# Patient Record
Sex: Female | Born: 1970 | Race: White | Hispanic: No | Marital: Single | Smoking: Never smoker
Health system: Southern US, Community
[De-identification: ages and names within clinical notes are randomized; demographics above are authoritative.]

## PROBLEM LIST (undated history)

## (undated) HISTORY — PX: ENDOMETRIAL ABLATION: SHX621

## (undated) HISTORY — PX: OTHER SURGICAL HISTORY: SHX169

---

## 2000-10-31 ENCOUNTER — Emergency Department (HOSPITAL_COMMUNITY): Admission: EM | Admit: 2000-10-31 | Discharge: 2000-10-31 | Payer: Self-pay | Admitting: Emergency Medicine

## 2000-10-31 ENCOUNTER — Encounter: Payer: Self-pay | Admitting: Emergency Medicine

## 2001-06-16 ENCOUNTER — Ambulatory Visit (HOSPITAL_COMMUNITY): Admission: RE | Admit: 2001-06-16 | Discharge: 2001-06-16 | Payer: Self-pay | Admitting: *Deleted

## 2002-03-01 ENCOUNTER — Other Ambulatory Visit: Admission: RE | Admit: 2002-03-01 | Discharge: 2002-03-01 | Payer: Self-pay | Admitting: *Deleted

## 2002-11-13 ENCOUNTER — Emergency Department (HOSPITAL_COMMUNITY): Admission: EM | Admit: 2002-11-13 | Discharge: 2002-11-13 | Payer: Self-pay | Admitting: Emergency Medicine

## 2003-02-01 ENCOUNTER — Other Ambulatory Visit: Admission: RE | Admit: 2003-02-01 | Discharge: 2003-02-01 | Payer: Self-pay | Admitting: *Deleted

## 2003-04-16 ENCOUNTER — Encounter: Payer: Self-pay | Admitting: Emergency Medicine

## 2003-04-16 ENCOUNTER — Emergency Department (HOSPITAL_COMMUNITY): Admission: EM | Admit: 2003-04-16 | Discharge: 2003-04-17 | Payer: Self-pay | Admitting: Emergency Medicine

## 2003-06-01 ENCOUNTER — Encounter: Payer: Self-pay | Admitting: Family Medicine

## 2003-06-01 ENCOUNTER — Encounter: Admission: RE | Admit: 2003-06-01 | Discharge: 2003-06-01 | Payer: Self-pay | Admitting: Family Medicine

## 2003-06-14 ENCOUNTER — Encounter: Payer: Self-pay | Admitting: Family Medicine

## 2003-06-14 ENCOUNTER — Ambulatory Visit (HOSPITAL_COMMUNITY): Admission: RE | Admit: 2003-06-14 | Discharge: 2003-06-14 | Payer: Self-pay | Admitting: Family Medicine

## 2004-10-03 ENCOUNTER — Ambulatory Visit: Payer: Self-pay | Admitting: Family Medicine

## 2004-10-03 ENCOUNTER — Other Ambulatory Visit: Admission: RE | Admit: 2004-10-03 | Discharge: 2004-10-03 | Payer: Self-pay | Admitting: Family Medicine

## 2005-01-15 ENCOUNTER — Ambulatory Visit: Payer: Self-pay | Admitting: Family Medicine

## 2005-05-21 ENCOUNTER — Encounter (INDEPENDENT_AMBULATORY_CARE_PROVIDER_SITE_OTHER): Payer: Self-pay | Admitting: *Deleted

## 2005-05-21 ENCOUNTER — Ambulatory Visit (HOSPITAL_COMMUNITY): Admission: RE | Admit: 2005-05-21 | Discharge: 2005-05-21 | Payer: Self-pay | Admitting: Obstetrics and Gynecology

## 2005-05-24 ENCOUNTER — Inpatient Hospital Stay (HOSPITAL_COMMUNITY): Admission: AD | Admit: 2005-05-24 | Discharge: 2005-05-26 | Payer: Self-pay | Admitting: Obstetrics and Gynecology

## 2005-05-29 ENCOUNTER — Inpatient Hospital Stay (HOSPITAL_COMMUNITY): Admission: AD | Admit: 2005-05-29 | Discharge: 2005-06-01 | Payer: Self-pay | Admitting: Obstetrics and Gynecology

## 2005-05-29 ENCOUNTER — Ambulatory Visit: Payer: Self-pay | Admitting: Internal Medicine

## 2005-05-31 ENCOUNTER — Ambulatory Visit: Payer: Self-pay | Admitting: Internal Medicine

## 2005-05-31 ENCOUNTER — Encounter (INDEPENDENT_AMBULATORY_CARE_PROVIDER_SITE_OTHER): Payer: Self-pay | Admitting: Specialist

## 2005-06-21 ENCOUNTER — Ambulatory Visit: Payer: Self-pay | Admitting: Family Medicine

## 2005-08-19 ENCOUNTER — Ambulatory Visit: Payer: Self-pay | Admitting: Family Medicine

## 2005-09-09 ENCOUNTER — Ambulatory Visit: Payer: Self-pay | Admitting: Family Medicine

## 2006-01-06 ENCOUNTER — Ambulatory Visit: Payer: Self-pay | Admitting: Family Medicine

## 2006-07-23 ENCOUNTER — Emergency Department: Payer: Self-pay

## 2006-10-26 IMAGING — CT CT ANGIO CHEST
3 series · 19 of 29 positions shown · IV contrast (150ml omni/300%)
Comparison: none

CLINICAL DATA: Hypoxia and fever.  Post-op from diagnostic laparoscopy.  Elevate for pulmonary embolism.  
CT ANGIOGRAPHY OF CHEST WITH CONTRAST:
TECHNIQUE: Multidetector CT imaging of the chest was performed during bolus injection of intravenous contrast.  Multiplanar CT angiographic image reconstructions were generated to evaluate the vascular anatomy.
Contrast:  150 cc Omnipaque 300
Satisfactory opacification of the pulmonary arteries is seen and there is no evidence of acute pulmonary embolism.  There is no evidence of thoracic aortic aneurysm or dissection.  There is no evidence of hilar or mediastinal mass.  No adenopathy is seen with in the thorax.
Tiny pleural effusions are noted bilaterally.  There is no evidence of pulmonary consolidation or mass.  Interstitial prominence is seen and mild interstitial edema cannot be excluded.  A small amount of free intraperitoneal air is also noted beneath the right hemidiaphragm, consistent with diagnostic laparoscopy performed yesterday.

[Series 2: pe chest · axial · 0.67mm/px · z∈[-242,-82]mm · 7 of 90 slices shown]
[im 13/90  lung]
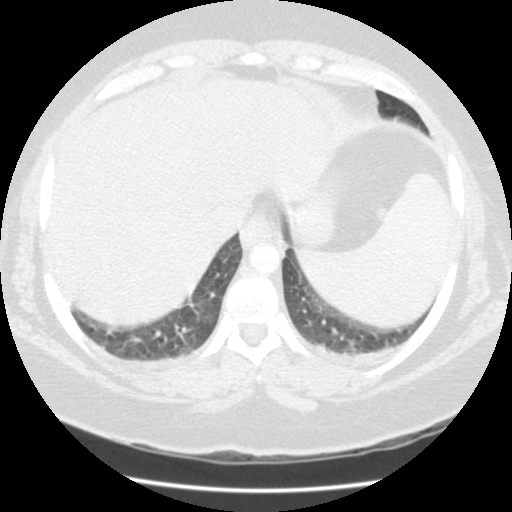
[im 26/90  mediastinal]
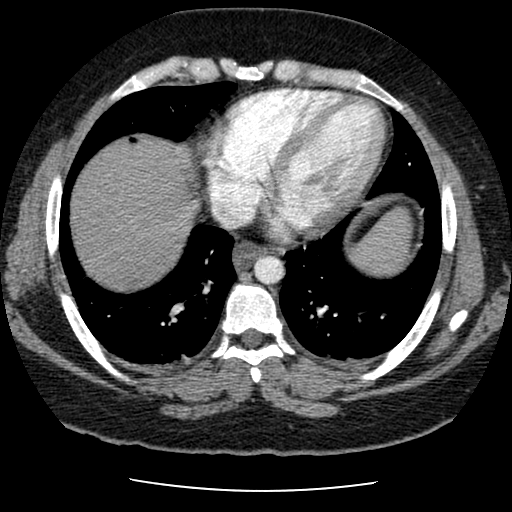
[im 39/90  lung]
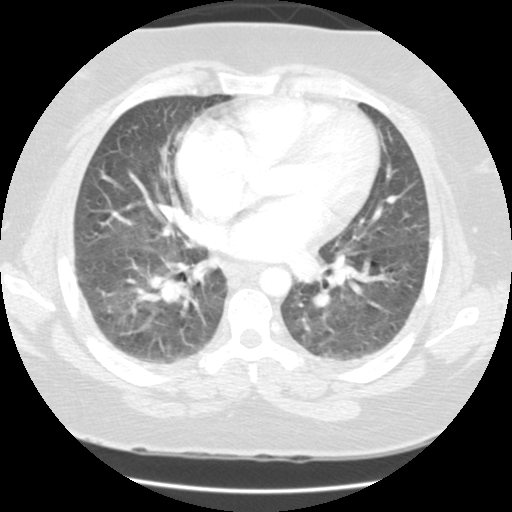
[im 47/90  mediastinal]
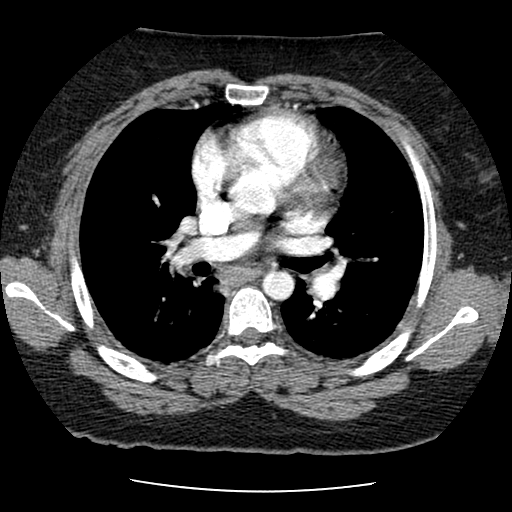
[im 51/90  lung]
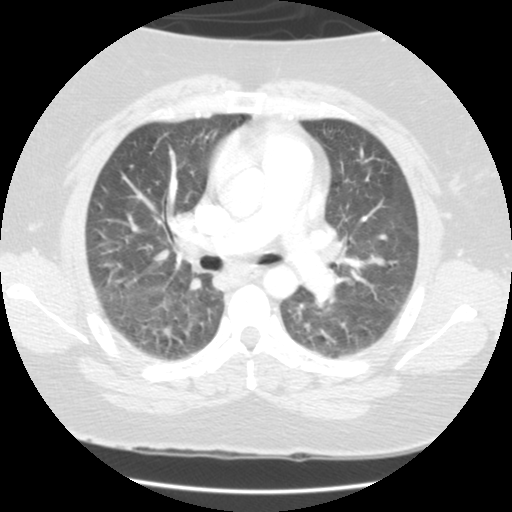
[im 64/90  mediastinal]
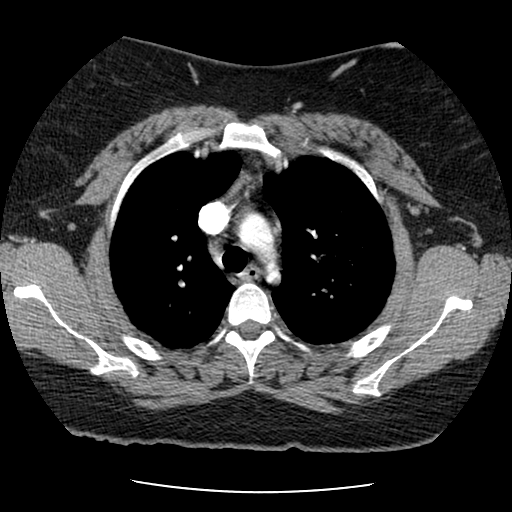
[im 77/90  lung]
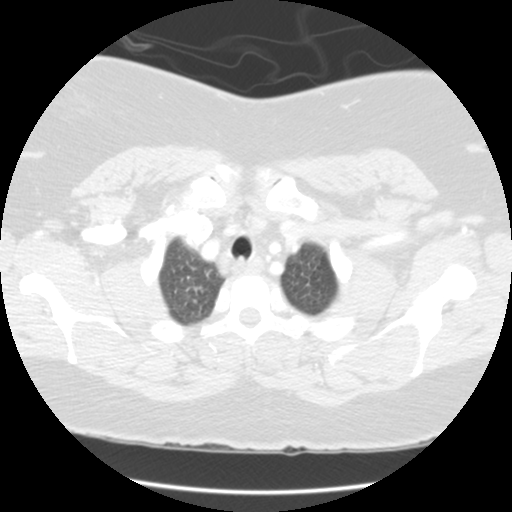

[Series 400: reformatted · coronal · 0.47mm/px · 8 of 152 slices shown (1 of 2)]
[im 13/152  lung]
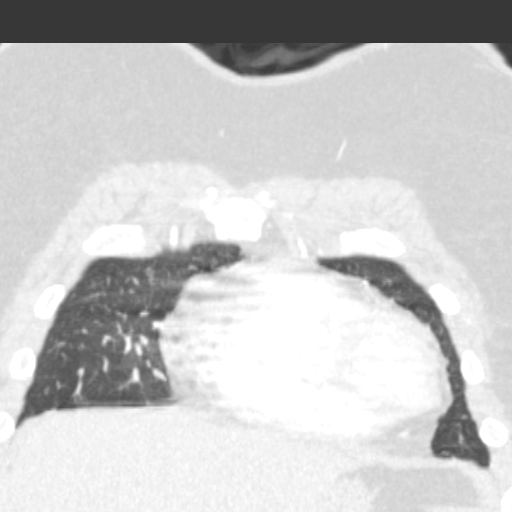
[im 38/152  lung]
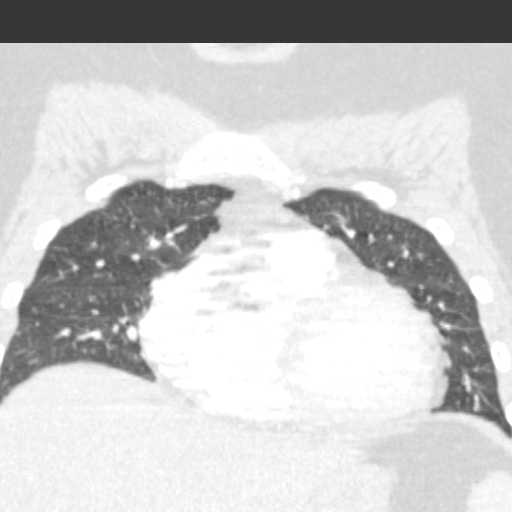
[im 51/152  lung]
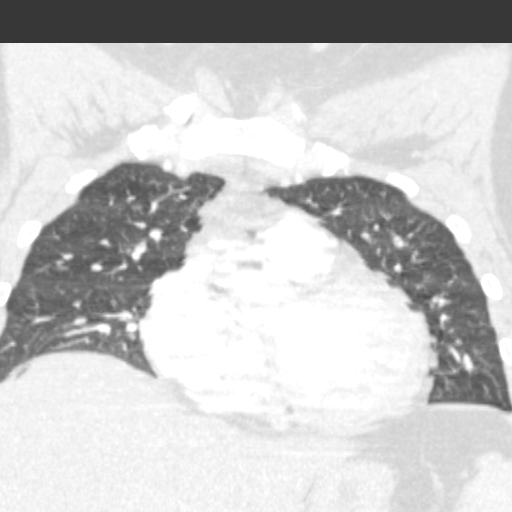
[im 63/152  lung]
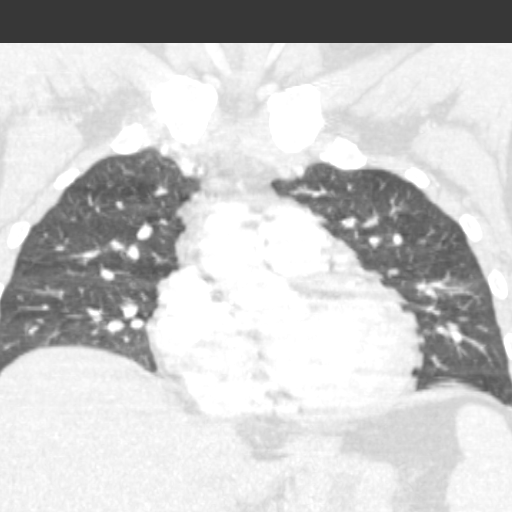
[im 89/152  lung]
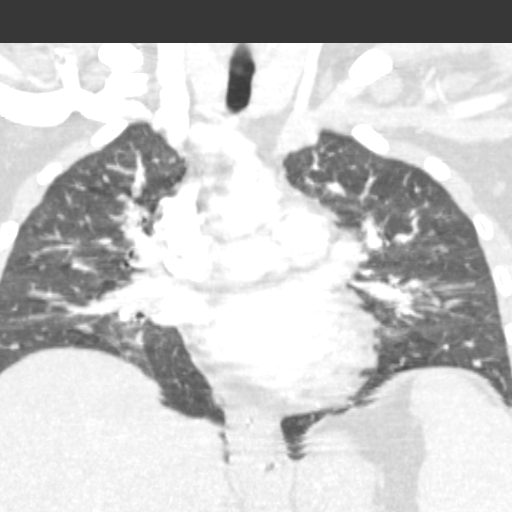
[im 101/152  lung]
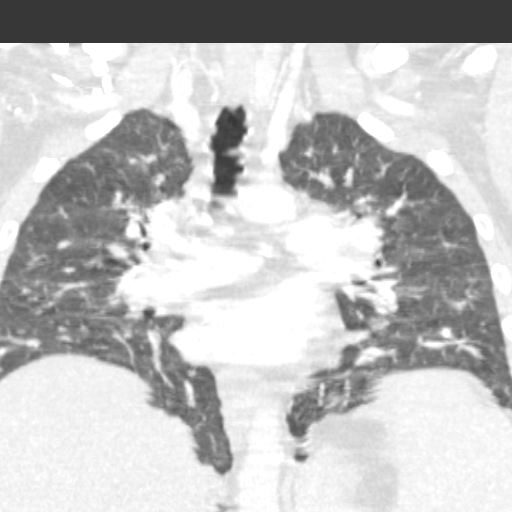
[im 114/152  lung]
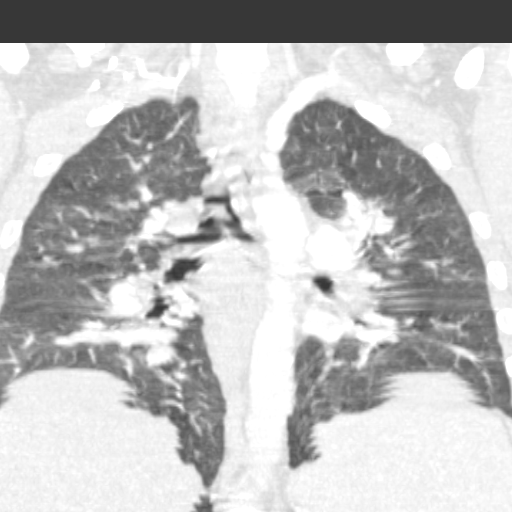
[im 139/152  lung]
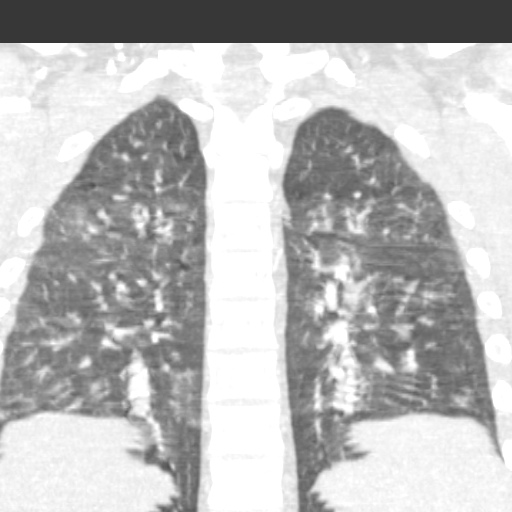

[Series 401: reformatted · sagittal · 0.47mm/px · 4 of 83 slices shown (2 of 2)]
[im 14/83  lung]
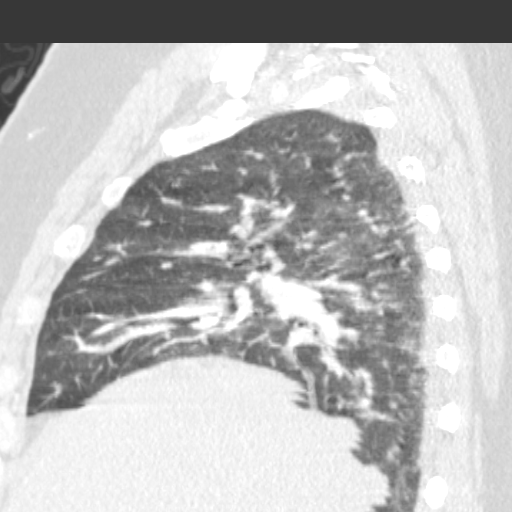
[im 28/83  lung]
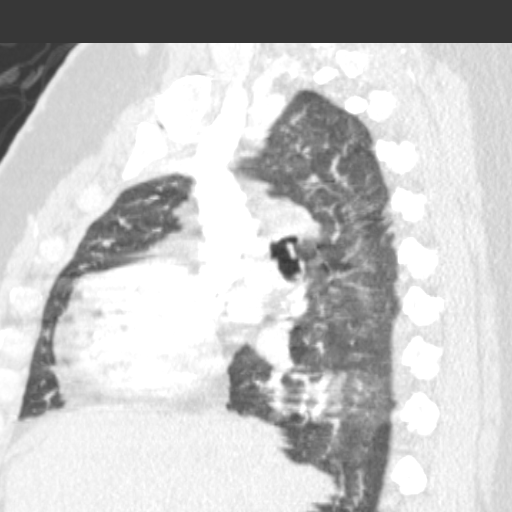
[im 42/83  lung]
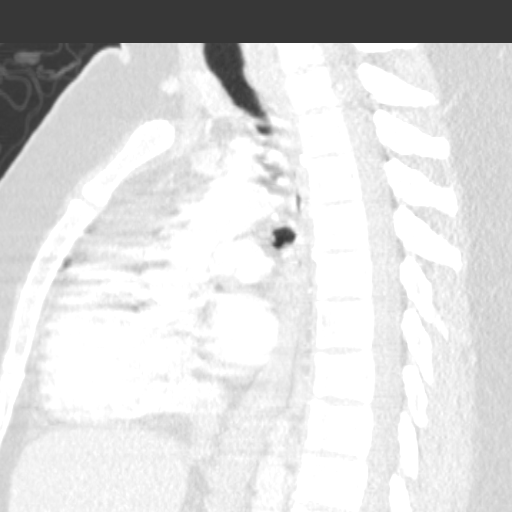
[im 55/83  lung]
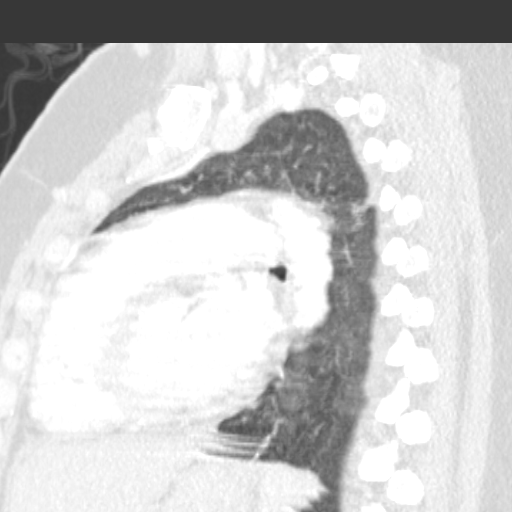

[19 of 29 positions shown; findings below may reference images not displayed]

IMPRESSION: 1.  No evidence of acute pulmonary embolism.  
2.  Tiny bilateral pleural effusions.  Pulmonary interstitial prominence suspicious for mild interstitial edema.

## 2007-03-16 ENCOUNTER — Ambulatory Visit: Payer: Self-pay | Admitting: Family Medicine

## 2007-03-20 ENCOUNTER — Ambulatory Visit: Payer: Self-pay | Admitting: Family Medicine

## 2007-03-20 LAB — CONVERTED CEMR LAB
CO2: 21 meq/L (ref 19–32)
Creatinine, Ser: 0.99 mg/dL (ref 0.40–1.20)
Eosinophils Absolute: 0.1 10*3/uL (ref 0.0–0.7)
Eosinophils Relative: 1 % (ref 0–5)
Glucose, Bld: 98 mg/dL (ref 70–99)
Hemoglobin: 16.6 g/dL — ABNORMAL HIGH (ref 12.0–15.0)
Lymphocytes Relative: 35 % (ref 12–46)
Lymphs Abs: 2.4 10*3/uL (ref 0.7–3.3)
Monocytes Absolute: 0.5 10*3/uL (ref 0.2–0.7)
Neutro Abs: 3.8 10*3/uL (ref 1.7–7.7)
Neutrophils Relative %: 56 % (ref 43–77)
Platelets: 193 10*3/uL (ref 150–400)
T4, Total: 9.9 ug/dL (ref 5.0–12.5)
TSH: 0.637 microintl units/mL (ref 0.350–5.50)

## 2007-08-26 ENCOUNTER — Ambulatory Visit (HOSPITAL_BASED_OUTPATIENT_CLINIC_OR_DEPARTMENT_OTHER): Admission: RE | Admit: 2007-08-26 | Discharge: 2007-08-26 | Payer: Self-pay | Admitting: Orthopedic Surgery

## 2007-10-03 DIAGNOSIS — R1031 Right lower quadrant pain: Secondary | ICD-10-CM

## 2007-10-07 ENCOUNTER — Ambulatory Visit: Payer: Self-pay | Admitting: Family Medicine

## 2007-10-08 ENCOUNTER — Telehealth: Payer: Self-pay | Admitting: Family Medicine

## 2007-11-17 ENCOUNTER — Ambulatory Visit: Payer: Self-pay | Admitting: Family Medicine

## 2007-11-26 ENCOUNTER — Encounter: Payer: Self-pay | Admitting: Obstetrics and Gynecology

## 2007-11-26 ENCOUNTER — Emergency Department (HOSPITAL_COMMUNITY): Admission: EM | Admit: 2007-11-26 | Discharge: 2007-11-26 | Payer: Self-pay | Admitting: Emergency Medicine

## 2008-01-07 ENCOUNTER — Telehealth (INDEPENDENT_AMBULATORY_CARE_PROVIDER_SITE_OTHER): Payer: Self-pay | Admitting: *Deleted

## 2008-01-12 ENCOUNTER — Encounter: Admission: RE | Admit: 2008-01-12 | Discharge: 2008-01-12 | Payer: Self-pay | Admitting: Family Medicine

## 2008-03-08 ENCOUNTER — Encounter: Payer: Self-pay | Admitting: Family Medicine

## 2008-04-15 ENCOUNTER — Ambulatory Visit: Payer: Self-pay | Admitting: Internal Medicine

## 2008-04-15 DIAGNOSIS — M545 Low back pain: Secondary | ICD-10-CM

## 2008-04-15 LAB — CONVERTED CEMR LAB
Glucose, Urine, Semiquant: NEGATIVE
Protein, U semiquant: NEGATIVE

## 2008-08-03 ENCOUNTER — Ambulatory Visit (HOSPITAL_COMMUNITY): Admission: RE | Admit: 2008-08-03 | Discharge: 2008-08-03 | Payer: Self-pay | Admitting: Obstetrics and Gynecology

## 2008-08-03 ENCOUNTER — Encounter (INDEPENDENT_AMBULATORY_CARE_PROVIDER_SITE_OTHER): Payer: Self-pay | Admitting: Obstetrics and Gynecology

## 2009-04-24 IMAGING — US US TRANSVAGINAL NON-OB
1 series · 14 of 25 positions shown · non-contrast
Comparison: none

CLINICAL DATA: History of ovarian cyst. Pain in the right lower quadrant.
 TRANSABDOMINAL AND TRANSVAGINAL PELVIC ULTRASOUND:
TECHNIQUE: Both transabdominal and transvaginal ultrasound examinations of the pelvis were performed including evaluation of the uterus, ovaries, adnexal regions, and pelvic cul-de-sac.

[Series 1: us transvaginal non-ob · 0.30mm/px · 14 of 36 slices shown]
[im 1/36]
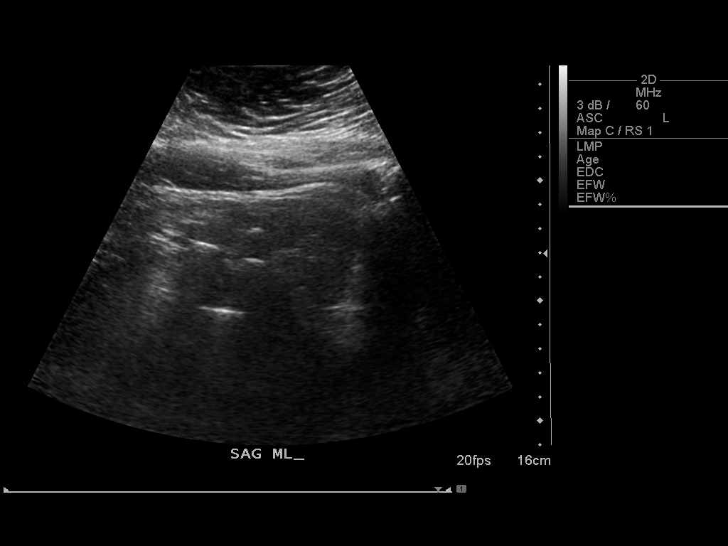
[im 3/36]
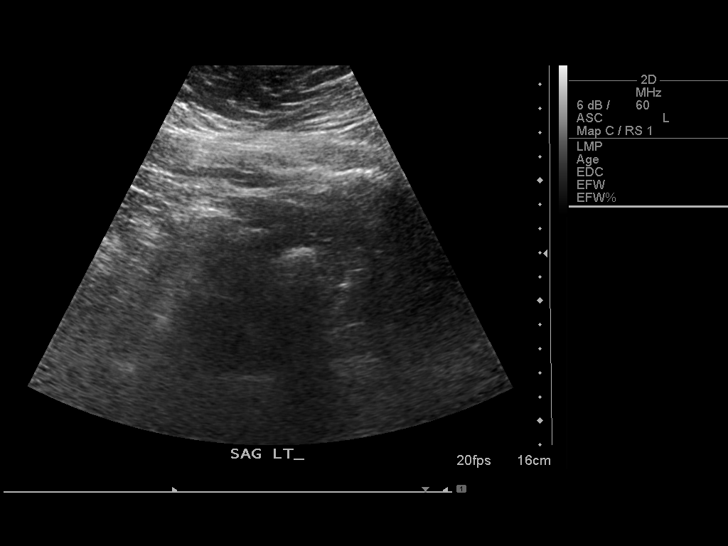
[im 6/36]
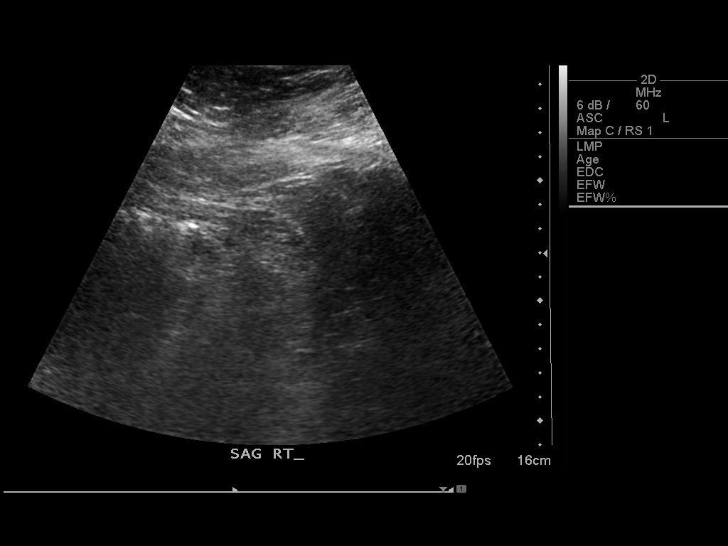
[im 9/36]
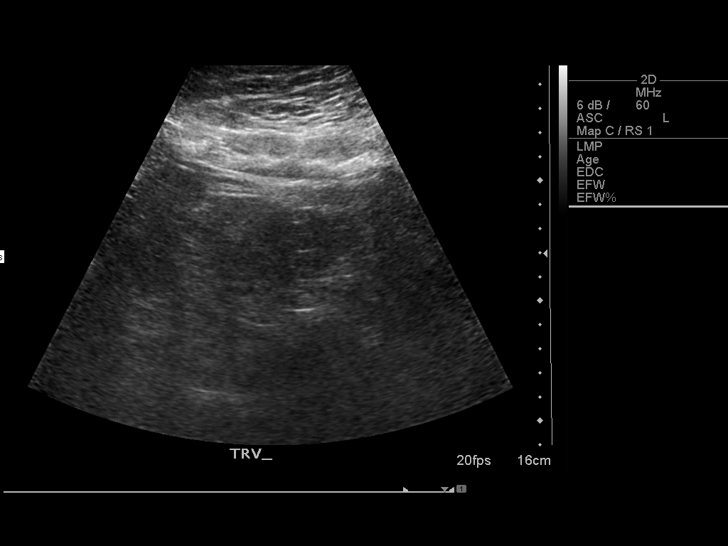
[im 12/36]
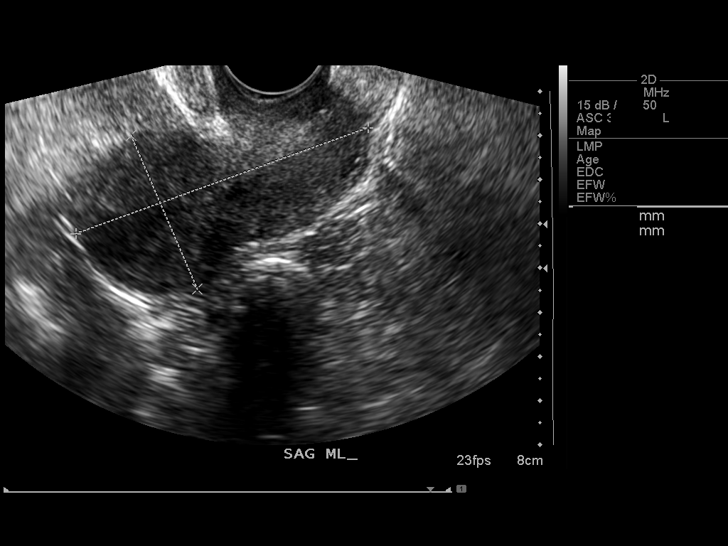
[im 14/36]
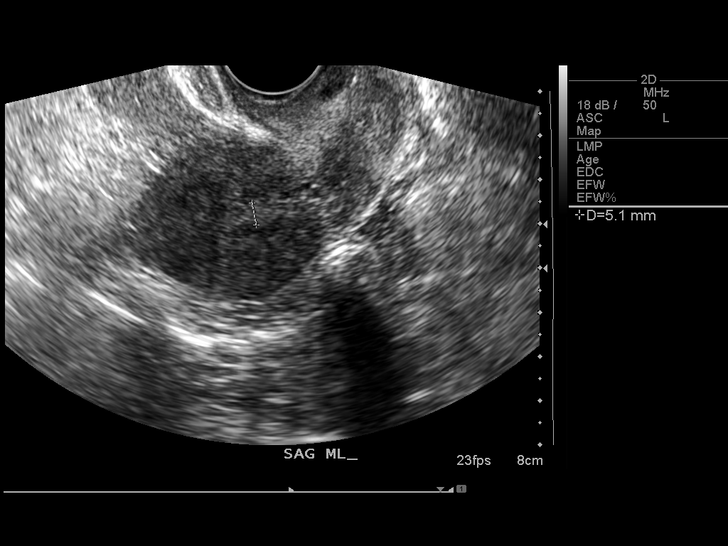
[im 17/36]
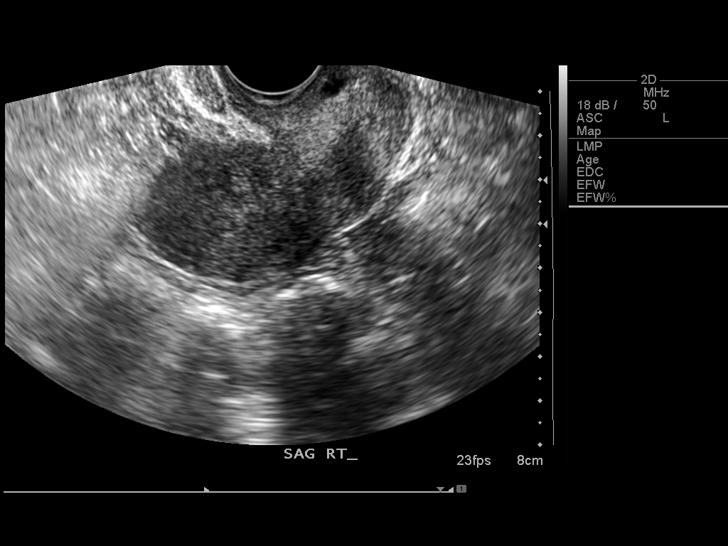
[im 19/36]
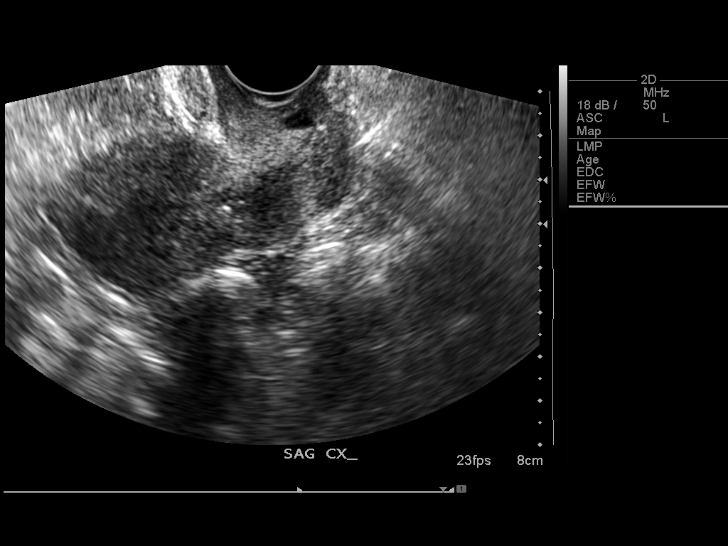
[im 22/36]
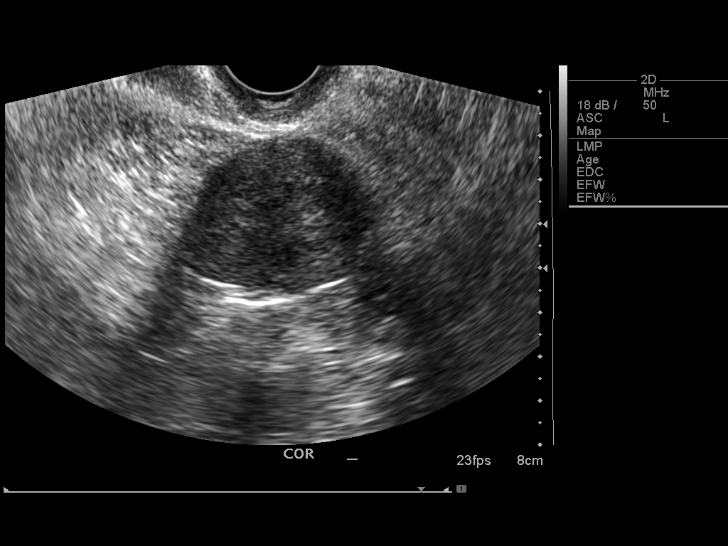
[im 24/36]
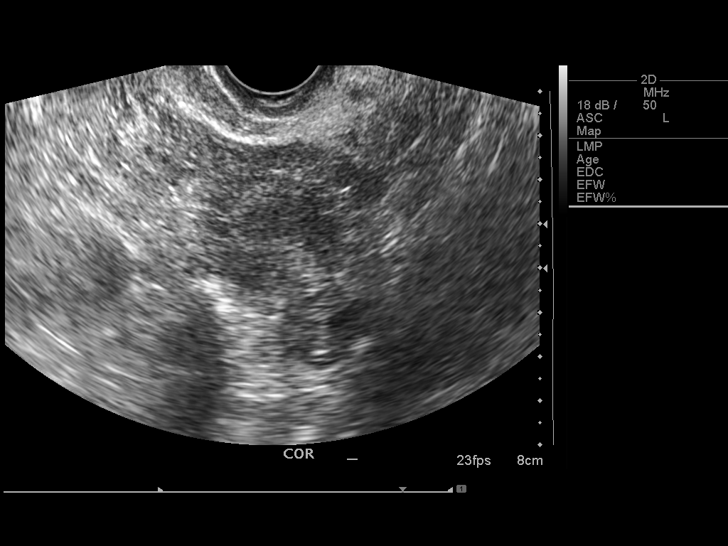
[im 27/36]
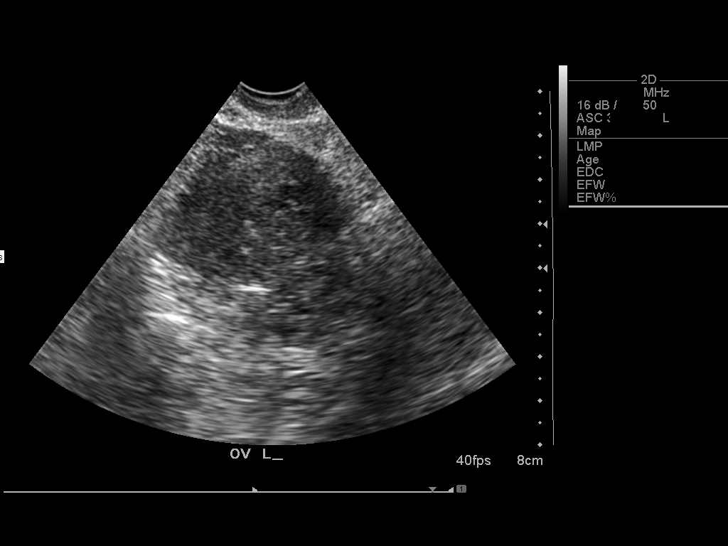
[im 30/36]
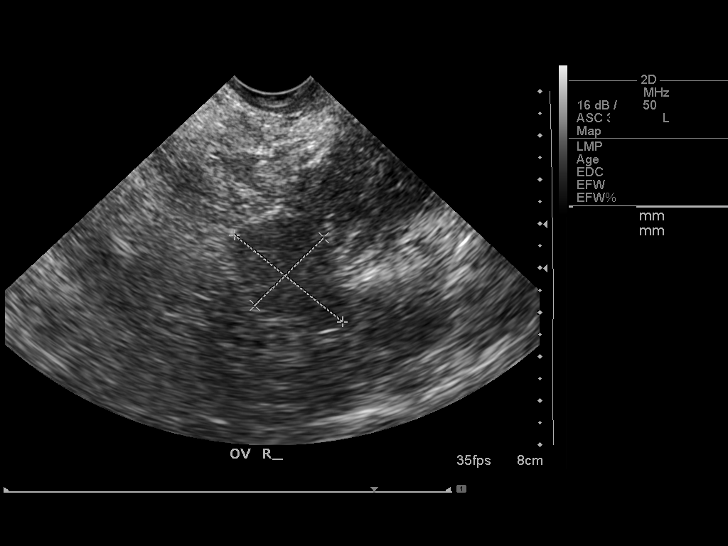
[im 33/36]
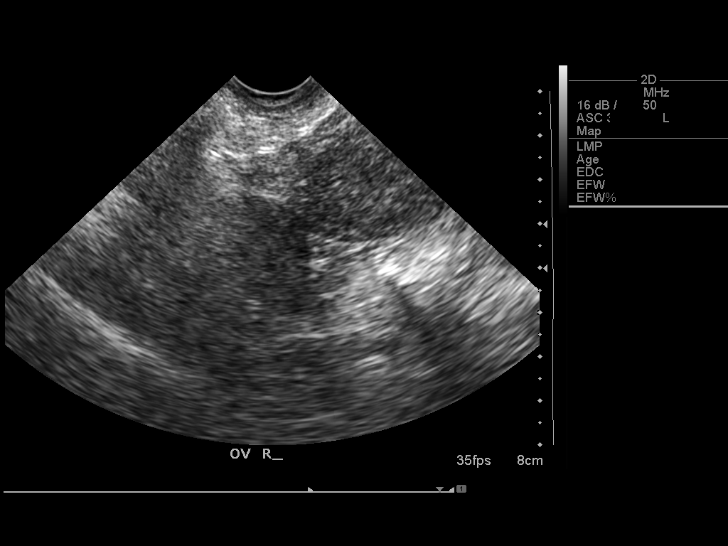
[im 36/36]
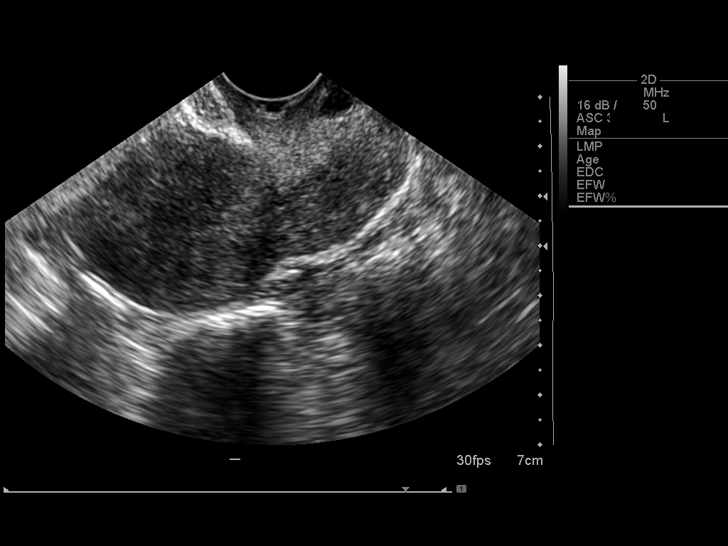

[14 of 25 positions shown; findings below may reference images not displayed]

FINDINGS: The uterus measures 7.0 cm sagittally with a depth of 3.7 cm and width of 4.2 cm.  Endometrium is not well seen secondary to prior ablation with a maximum thickness noted of 5.1 mm.  The ovaries are normal in size with no ovarian cyst evident and no free fluid is seen.
IMPRESSION: No ovarian cyst. No free fluid.

## 2009-06-10 IMAGING — US US PELVIS COMPLETE
1 series · 14 of 25 positions shown · non-contrast
Comparison: none

CLINICAL DATA: Right lower quadrant pain. 
 TRANSABDOMINAL AND TRANSVAGINAL PELVIC ULTRASOUND:
TECHNIQUE: Both transabdominal and transvaginal ultrasound examinations of the pelvis were performed, including evaluation of the uterus, ovaries, adnexal regions, and pelvic cul-de-sac.

[Series 1: us pelvis complete · 0.35mm/px · 14 of 40 slices shown]
[im 1/40]
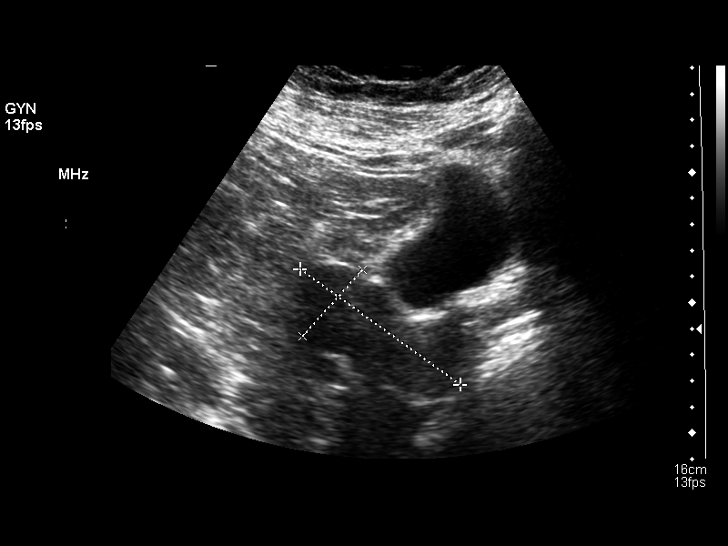
[im 4/40]
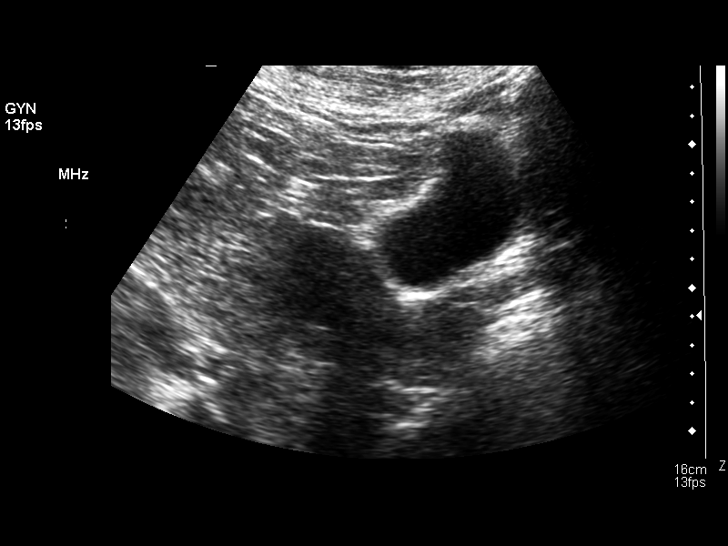
[im 7/40]
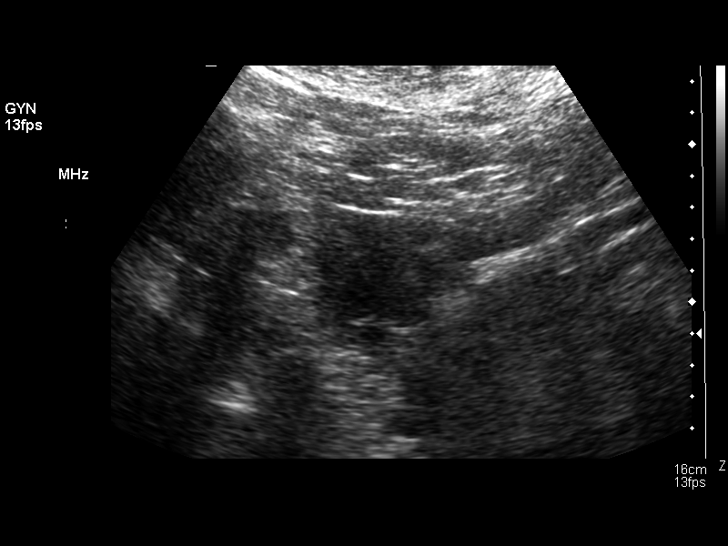
[im 10/40]
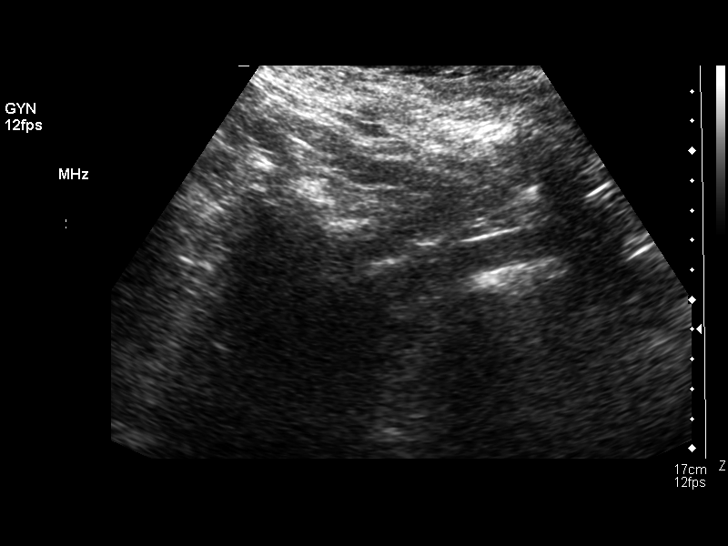
[im 14/40]
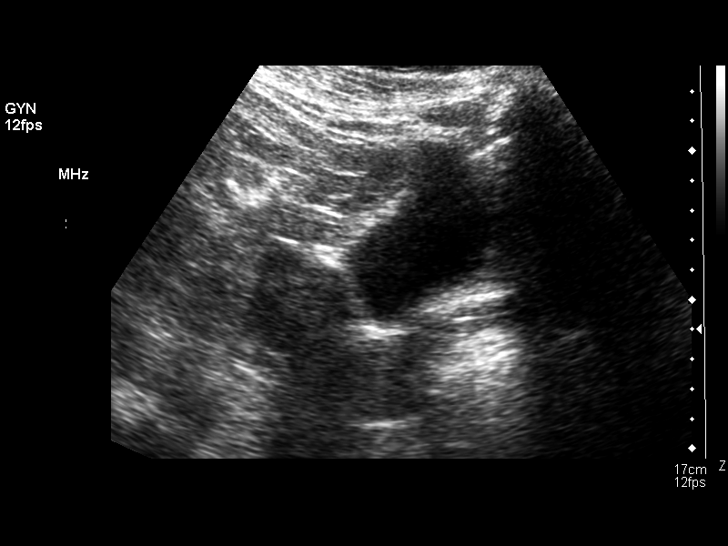
[im 15/40]
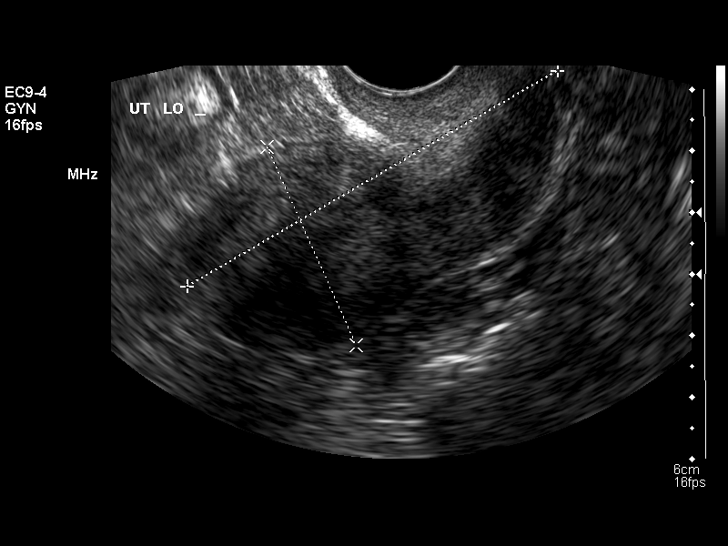
[im 18/40]
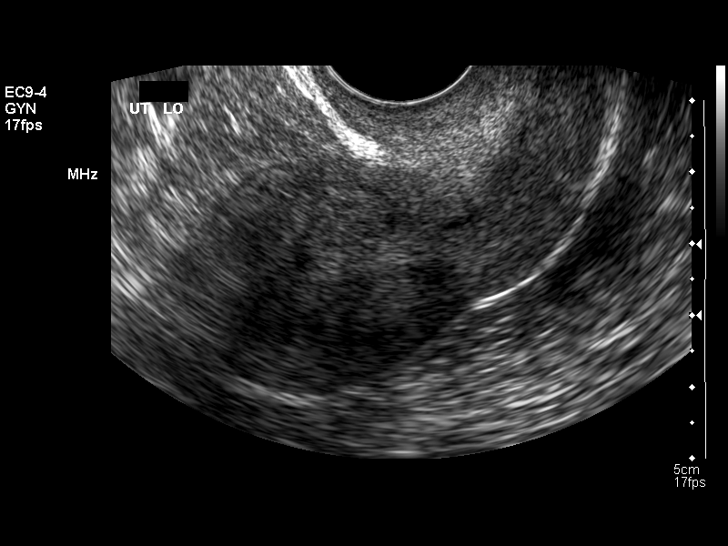
[im 22/40]
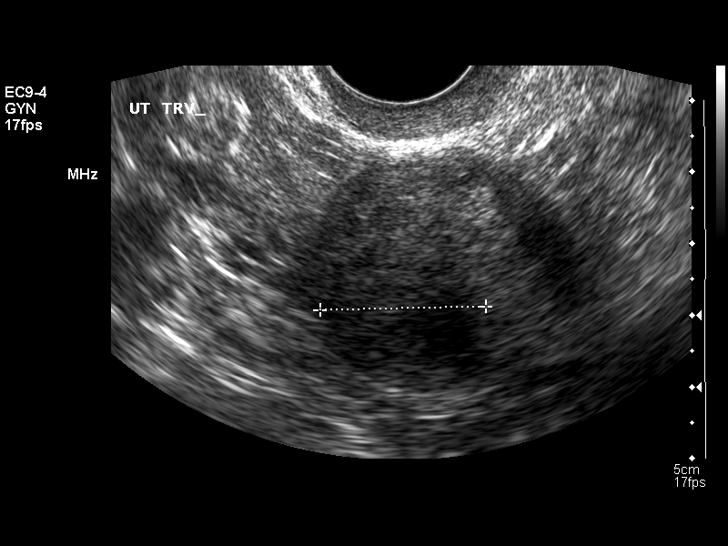
[im 25/40]
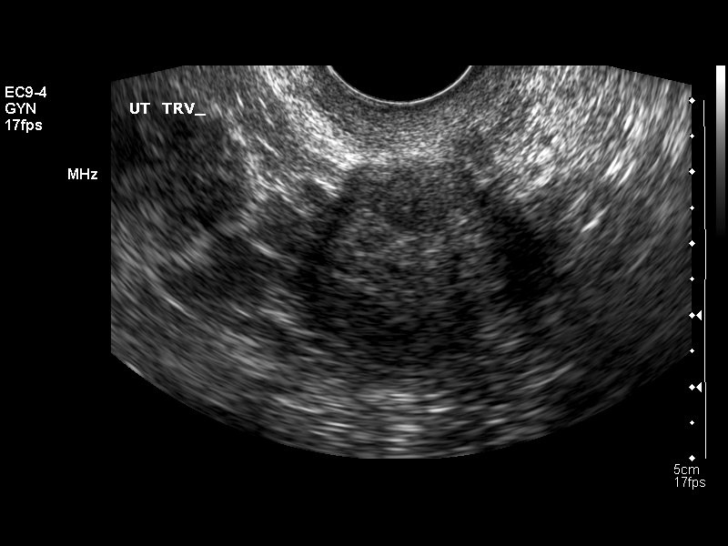
[im 27/40]
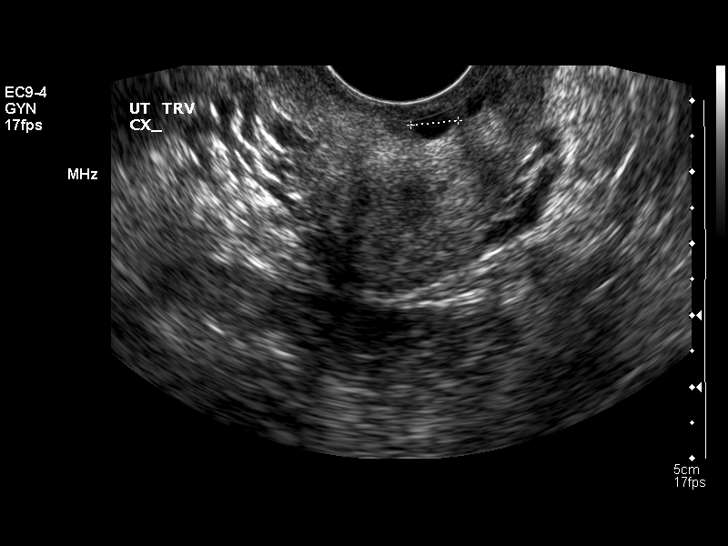
[im 30/40]
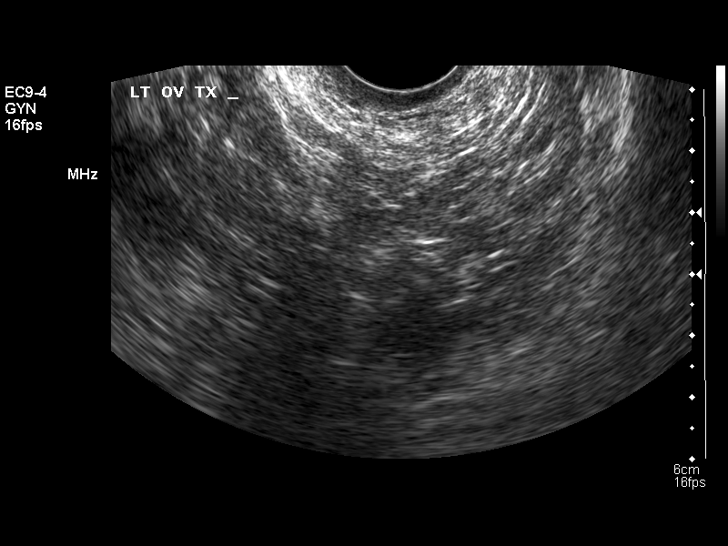
[im 33/40]
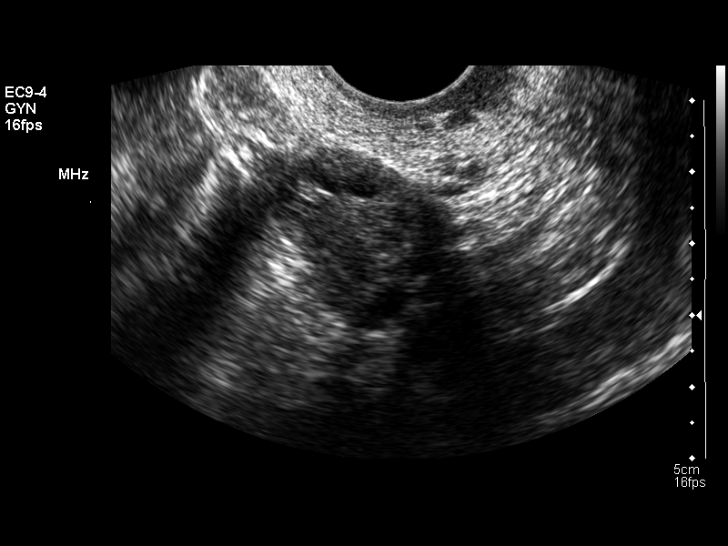
[im 36/40]
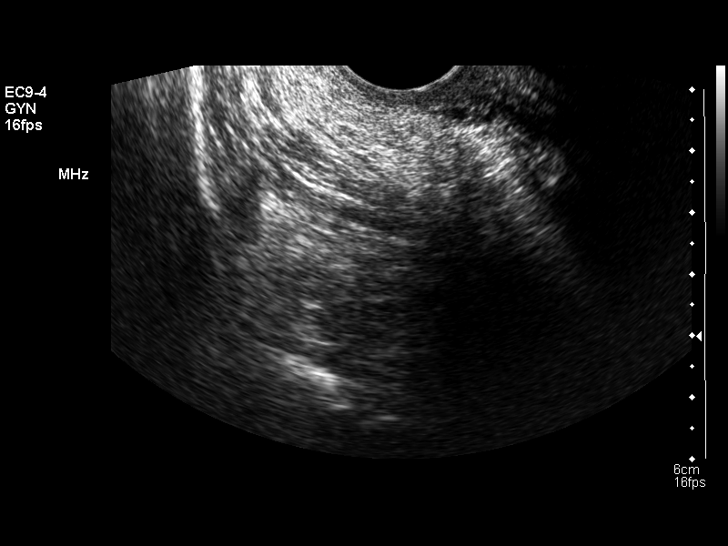
[im 40/40]
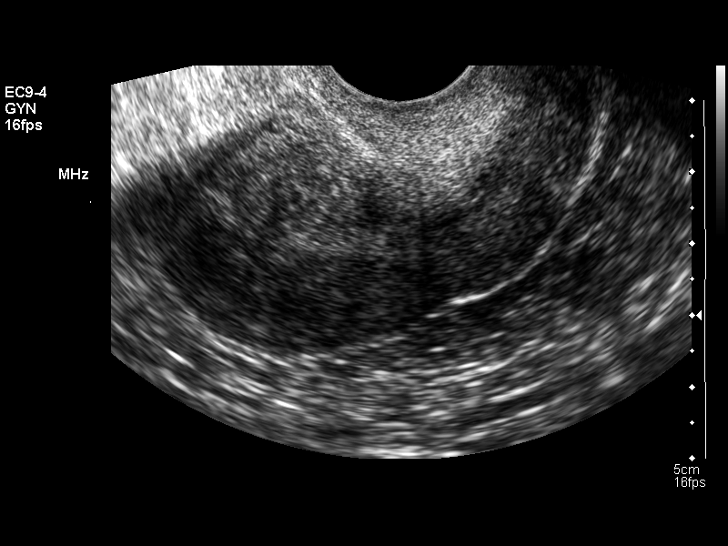

[14 of 25 positions shown; findings below may reference images not displayed]

FINDINGS: The uterus measures 7.0 cm sagittally with a depth of 3.5 cm and width of 3.8 cm.  There does appear to be a single fibroid from the posterior fundus of 2.4 x 1.8 x 2.3 cm.  The patient has a history of endometrial ablation in 6334 with endometrium measuring 5 mm in thickness.  The right ovary is normal in size.  The left ovary is not seen.  No free fluid is seen.
IMPRESSION: 2.4 cm posterior fundal fibroid.  Prior endometrial ablation.  Right ovary normal in size with the left ovary not seen.

## 2011-04-16 NOTE — Op Note (Signed)
NAME:  Courtney Little, Courtney Little                 ACCOUNT NO.:  [id number]   MEDICAL RECORD NO.:  [id number]          PATIENT TYPE:  AMB   LOCATION:  SDC                           FACILITY:  [facility]   PHYSICIAN:  Lenoard Aden, M.D.DATE OF BIRTH:  1971-06-15   DATE OF PROCEDURE:  08/03/2008  DATE OF DISCHARGE:                               OPERATIVE REPORT   PREOPERATIVE DIAGNOSES:  1. Right lower quadrant pain.  2. History of endometriosis.   POSTOPERATIVE DIAGNOSES:  1. Right lower quadrant pain.  2. History of endometriosis.  3. Pelvic endometriosis.   PROCEDURES:  1. Diagnostic laparoscopy.  2. Ablation of pelvic endometriosis.  3. Right salpingo-oophorectomy.   SURGEON:  Lenoard Aden, MD   ASSISTANT:  Eliberto Ivory. Rosalio Macadamia, M.D.   ANESTHESIA:  General.   ESTIMATED BLOOD LOSS:  Less than 50 mL.   COMPLICATIONS:  None.   DRAINS:  Foley.   COUNTS:  Correct.   The patient to recovery in good condition.   BRIEF OPERATIVE NOTE:  After being apprised of risks of anesthesia,  infection, bleeding, intra-abdominal injury, delayed versus immediate  complications to include bowel and bladder injury with need for repair,  inability to cure pelvic pain.  The patient was brought to the operating  where she was administered general anesthetic without complications,  prepped and draped  in usual sterile fashion.  Foley catheter was  placed.  Rubens cannula placed per vagina in standard fashion.  Infraumbilical incision made with a scalpel.  Fascia identified, grasped  using a Kocher clamp and opened peritoneal entry made bluntly.  Pursestring suture was placed around the fascial opening in the fascia  and Hassan trocar was placed atraumatically.  Pictures were taken.  Normal liver area, appendix not visualized, but normal periappendiceal  area with some filmy adhesions noted.  The uterus appeared normal size,  shape, and contour.  Both tubes were previously divided with Falope  ring.   There appears to be a small hemosalpinx on the right and  endometriosis along the bladder flap and in the right ovarian fossa.  At  this time, a normal left ovary was noted and an interrupted left tube as  noted was also identified and pictures were taken.  The right  infundibulopelvic ligament is grasped, elevated.  The right ureter is  identified and this infundibulopelvic ligament is ligated using the  gyrus.  This progressive ligation with the gyrus is performed along the  tubo-ovarian ligament and excising the right tube and ovary along its  base and was placed in the anterior cul-de-sac.  Hydrodissection was  performed and the peritoneal endometriosis on the right is grasped and  ablated using the gyrus after identifying the ureter throughout this  process.  Good hemostasis is noted.  The bladder flap peritoneum is  elevated, grasped and ablated the endometriosis along the anterior cul-  de-sac. Cul-de-sac specimen of the right tube and ovary is then placed  with Falope rings, placed in the EndoCatch and removed through the  abdominal port observing through a 5-mm scope.  This specimen was then  removed  intact.  Revisualization reveals good hemostasis.  Irrigation  was accomplished.  All instruments removed under direct visualization.  CO2 is released.  Incisions are closed using 0 Vicryl, 4-0 Vicryl, and  Dermabond.  Instruments are removed from the vagina.  The patient  tolerated the procedure well and was awakened and transferred to  recovery in good condition.      Lenoard Aden, M.D.  Electronically Signed     RJT/MEDQ  D:  08/03/2008  T:  08/04/2008  Job:  [id number]

## 2011-04-16 NOTE — Op Note (Signed)
NAME:  Courtney Little, Courtney Little                 ACCOUNT NO.:  [id number]   MEDICAL RECORD NO.:  [id number]          PATIENT TYPE:  AMB   LOCATION:  DSC                          FACILITY:  [facility]   PHYSICIAN:  Feliberto Gottron. Turner Daniels, M.D.   DATE OF BIRTH:  February 27, 1971   DATE OF PROCEDURE:  08/26/2007  DATE OF DISCHARGE:                               OPERATIVE REPORT   PREOPERATIVE DIAGNOSIS:  Right calcaneus pump bump.   POSTOPERATIVE DIAGNOSIS:  Right calcaneus pump bump.   PROCEDURE:  Excision right calcaneus pump bump.   SURGEON:  Feliberto Gottron. Turner Daniels, M.D.   ASSISTANT:  None.   ANESTHESIA:  General LMA.   ESTIMATED BLOOD LOSS:  Minimal.   FLUID REPLACEMENT:  500 mL crystalloid.   DRAINS PLACED:  None.   INDICATIONS FOR PROCEDURE:  40 year old woman who has had a previous  left foot pump incision with good results and now desires the same on  the right side.  She has a palpable bump on the lateral side of the  calcaneus insertion that is painful, especially with shoe wear.  She has  failed conservative treatment with anti-inflammatory medicine and  attempts at weight loss and desires elective removal of the pump bump to  decrease pain and increase function.  The risks and benefits of surgery  were discussed, questions answered.   DESCRIPTION OF PROCEDURE:  The patient was identified by armband and  [facility]went [facility] in [facility] block area.  After the appropriate anesthetic monitors were attached, she was then  taken to the operative suite, appropriate anesthetic monitors reattached  and general LMA anesthesia induced.  She was then rolled into the left  lateral decubitus position and held there with a beanbag.  A tourniquet  was applied to the lower right calf and the right foot prepped and  draped in the usual sterile fashion from the toes to the tourniquet.  The limb was wrapped with an Esmarch bandage and the tourniquet inflated  to 350 mmHg.  A longitudinal  incision on the lateral side of the  Achilles tendon insertion was made through the skin and subcutaneous  tissue down to the level of the pump bump about 3.5 to 4 cm in length.  Small bleeders were identified and cauterized.  The pump bump was  outlined and then using a 1/2 inch wide straight chisel, removed using  the osteotome to excise the pump bump. Rongeurs were then used to smooth  off the edges and make sure there was no further bony impingement.  The  tourniquet was let down.  Small bleeders were again identified and  cauterized.  The wound was irrigated out with  normal saline solution and then the subcutaneous tissue was closed with  running 3-0 Vicryl suture and the skin with running interlocking 4-0  nylon suture.  A dressing of Xeroform, 4x4 dressing sponges, Webril and  Ace wrap applied.  The patient was then laid supine, awakened, and taken  to the recovery room without difficulty.      Feliberto Gottron. Turner Daniels, M.D.  Electronically  Signed     FJR/MEDQ  D:  08/26/2007  T:  08/26/2007  Job:  [id number]

## 2011-04-16 NOTE — H&P (Signed)
NAME:  Courtney Little, Courtney Little NO.:  [id number]   MEDICAL RECORD NO.:  [id number]          PATIENT TYPE:  AMB   LOCATION:  SDC                           FACILITY:  [facility]   PHYSICIAN:  Lenoard Aden, M.D.DATE OF BIRTH:  01/18/1971   DATE OF ADMISSION:  08/03/2008  DATE OF DISCHARGE:                              HISTORY & PHYSICAL   CHIEF COMPLAINT:  Right lower quadrant pain.   She is a 40 year old white female G2, P1, history of tubal ligation,  history of NovaSure ablation with intermittent worsening right-sided  pain.  Presumptively treated for endometriosis in the past with  continued discomfort.  She had no relief on Depo-Provera or Lupron.  She  presents for definitive surgical assessment for her right-sided pain.   ALLERGIES:  She has allergies to LATEX and ATENOLOL.   MEDICATIONS:  Aygestin, Ibuprofen, Vicodin, Aleve, and Zofran.   SURGICAL HISTORY:  Had an endometrial ablation, tubal ligation, D & C,  cholecystectomy, tonsillectomy, and foot surgery.   FAMILY HISTORY:  Contributory for diabetes, heart disease, chronic  hypertension, and myocardial infarction.   PHYSICAL EXAMINATION:  GENERAL:  She is a well-developed, well-nourished  white female.  VITAL SIGNS:  Obese with a weight of 300 pounds, blood pressure is  146/98.  HEENT:  Normal.  LUNGS:  Clear.  HEART:  Regular rate and rhythm.  ABDOMEN:  Soft and nontender.  A well healed surgical scar is noted.  PELVIC:  Normal sized uterus.  No adnexal masses.  EXTREMITIES:  There are no cords.  NEUROLOGIC:  Nonfocal.  SKIN:  Intact.   Ultrasound reveals a normal-sized uterus and bilateral normal adnexa.   IMPRESSION:  Right lower quadrant pain, status post NovaSure, status  post tubal ligation, history of presumptive endometriosis with no relief  with medical therapy.  Plan is to proceed with diagnostic laparoscopy,  possible ablation  of endometriosis, probable right salpingo-oophorectomy.  Risks  of  anesthesia, infection, bleeding, injury to abdominal organs, need for  repair was discussed.  Delayed versus immediate complications include  bowel or bladder injury are noted and the patient acknowledges and  wishes to proceed.      Lenoard Aden, M.D.  Electronically Signed     RJT/MEDQ  D:  08/03/2008  T:  08/03/2008  Job:  [id number]

## 2011-04-19 NOTE — Discharge Summary (Signed)
Courtney Little, Courtney Little NO.:  [id number]   MEDICAL RECORD NO.:  [id number]          PATIENT TYPE:  INP   LOCATION:  9315                          FACILITY:  [facility]   PHYSICIAN:  Carrington Clamp, M.D. DATE OF BIRTH:  10/11/1971   DATE OF ADMISSION:  05/24/2005  DATE OF DISCHARGE:  05/26/2005                                 DISCHARGE SUMMARY   ADMITTING DIAGNOSES:  Probable postoperative endometritis.   DISCHARGE DIAGNOSES:  Viral gastrointestinal illness with postoperative  pain.   PERTINENT TEST RESULTS:  CBC:  White blood cell count 4.8 and H&H of 14 and  40.  CT of the abdomen and pelvis showing small mild infiltrate or  atelectasis in the left lower lobe of the lung.  Otherwise, negative.   HISTORY AND PHYSICAL:  This is a 40 year old G1, P1 status post D&C and  hysteroscopy and NovaSure ablation on June 20 complaining of fever for three  days postoperatively.  Patient stated no complications with postoperative  course on day of surgery or day after, but felt bad a little bit later on.  Patient had felt bad on June 22, but did not have a fever on that day.  The  morning of admission she had chills and fever at about 1:30, vomited three  times, and kept Motrin, but no fluids down.  No vomiting any longer if she  does not eat, but still feels nauseous.  Patient had diarrhea two days ago,  but no blood in the diarrhea and no diarrhea since.  No shortness of breath.  Some pain in the abdomen at the umbilicus.   PAST MEDICAL HISTORY:  Hypertension.   PAST SURGICAL HISTORY:  1.  D&C, hysteroscopy, and NovaSure which was completely uncomplicated.  2.  Tonsils and adenoids.  3.  Laparoscopy.  4.  Bilateral tubal ligation.   PAST OBSTETRICAL HISTORY:  Term spontaneous vaginal delivery x1.   PAST GYNECOLOGICAL HISTORY:  No sexually transmitted diseases.   ALLERGIES:  LATEX and BETA BLOCKERS.   MEDICATIONS:  Diovan 80.   Patient does not smoke.   PHYSICAL  EXAMINATION:  VITAL SIGNS:  Temperature 101.1, pulse 100s, blood  pressure 120/80.  LUNGS:  Clear to auscultation bilaterally and possibly noted some CVA  tenderness, but was not clinically significant.  HEART:  Regular rate and rhythm.  ABDOMEN:  Normal bowel sounds in the right upper quadrant.  No rebound.  No  guarding.  Pain midline low.  EXTREMITIES:  Without clubbing or edema.  PELVIC:  Normal external genitalia.  Speculum revealed some discharge from  the os, but not copious or foul.  Culture was sent of this.  Bimanual  examination revealed tender uterus and cervix but no masses were felt.   ASSESSMENT:  Probable postoperative endometritis.   PLAN:  Admit to start IV fluids, to do CT of abdomen and pelvis with  contrast, to start the patient on Unasyn 3 g q.6h. and obtain a CBC, CMET  the following morning, and to give nausea and pain medication for  symptomatic relief.  The patient was also given Pepcid  20 mg b.i.d. to help  with GI symptoms.  On hospital day #2 the patient still had temperature of  101.  The CT showed minimal subsegmental atelectasis in the left lower  quadrant, otherwise no free air or bowel abnormalities were seen.  There  were no focal masses.  Patient's diet was advanced.  On hospital day #2  patient's fever earlier that morning had been 102.3 but currently she was at  98.2.  The culture of the uterus showed abundant white blood cells with gram-  positive and negative rods, but no specific organisms.  The patient was  feeling better and tolerating food and was discharged with the following.   DISCHARGE MEDICATIONS:  1.  Augmentin 875 mg b.i.d. x7 days.  2.  Phenergan p.r.n. nausea.  3.  Percocet p.r.n. pain.  4.  Motrin p.r.n. pain.  5.  Prevacid at home.  6.  Diovan at home.   ACTIVITY:  Routine.   DIET:  Bland.   The patient was to follow up with an appointment on the following Tuesday.      Carrington Clamp, M.D.  Electronically  Signed     MH/MEDQ  D:  08/22/2005  T:  08/22/2005  Job:  [id number]

## 2011-04-19 NOTE — H&P (Signed)
[facility]  Patient:    Courtney Little, Courtney Little [facility]                           MRN: [id number] Adm. Date:  06/16/01 Attending:  Katy Fitch, M.D.                         History and Physical  CHIEF COMPLAINT:              Multiparity, desires sterility.  HISTORY OF PRESENT ILLNESS:   The patient is a 40 year old, G2, P2, who is desiring permanent sterility.  The patient was extensively counselled on having permanent sterilization versus semipermanent sterilization with IUD. The patient had an IUD placed approximately two months ago and during her first month had severe cramping and bleeding problems and wanted IUD removed. Based on her age and her number of children, the patient was once again counselled as to other forms of birth control including birth control pills, Depo-Provera shot, or barrier contraception.  The patient states that she and her significant other have thoroughly discussed this, and she does desire to undergo permanent sterilization.  PAST MEDICAL HISTORY:         None.  PAST SURGICAL HISTORY:        None.  PAST OBSTETRICAL HISTORY:     The patient has one living child with one miscarriage.  PAST GYNECOLOGIC HISTORY:     No history of abnormal Paps or STDs.  MEDICATIONS:                  None.  ALLERGIES:                    LATEX and SEAFOOD.  SOCIAL HISTORY:               Denies any tobacco, alcohol, or drugs.  FAMILY HISTORY:               Without any epithelial cancers.  PHYSICAL EXAMINATION:  VITAL SIGNS:                  Blood pressure 112/70, weight 237.  HEENT:                        Throat clear.  LUNGS:                        Clear to auscultation bilaterally.  HEART:                        Regular rate and rhythm.  ABDOMEN:                      Soft, nontender, nondistended.  Normal abdominal bowel sounds.  EXTREMITIES:                  Without edema, clubbing, cyanosis, or tenderness.  PELVIC:                        Normal external genitalia.  Cervix without any gross lesions.  Uterus firm, mobile, nontender.  Adnexa without masses, nontender.  PLAN:                         The patient will undergo laparoscopic tubal sterilization with Silastic band.  The patient was extensively counselled on options as previously stated.  The patient was also given risks for the procedure including bleeding, transfusion, infections, scar tissue, wound breakdown, damage to internal organs, fistula formation, need to open with laparotomy incision secondary to bleeding or damage to internal organs, postoperative pain, anesthetic complications, blood clot, stroke, pulmonary embolism, or death.  The patient states she understands this and desires to proceed.  The patient will have a urine pregnancy test and CBC done before surgery, and the patient will be scheduled on July 16. DD:  06/12/01 TD:  06/13/01 Job: [id number]

## 2011-04-19 NOTE — Discharge Summary (Signed)
NAMESHARMON, CHERAMIE NO.:  [id number]   MEDICAL RECORD NO.:  [id number]          PATIENT TYPE:  INP   LOCATION:  6736                         FACILITY:  [facility]   PHYSICIAN:  Gordy Savers, M.D. LHCDATE OF BIRTH:  [birth year]   DATE OF ADMISSION:  05/30/2005  DATE OF DISCHARGE:  06/01/2005                                 DISCHARGE SUMMARY   FINAL DIAGNOSIS:  Acute febrile illness.   ADDITIONAL DIAGNOSES:  1.  Nausea, vomiting, abdominal pain.  2.  Gastritis.  3.  Pancytopenia.  4.  Elevated liver function studies.  5.  History of hypertension.  6.  Status post endometrial ablation on May 21, 2005.   DISCHARGE MEDICATIONS:  1.  Avapro 150 mg daily.  2.  Protonix 40 mg b.i.d.   HISTORY OF PRESENT ILLNESS:  The patient is a 40 year old white female,  status post endometrial ablation on May 21, 2005.  She was admitted to  [facility] last weekend for fever and abdominal pain.  CT scan was  negative.  The patient underwent laparoscopic evaluation that revealed no  abnormalities.  The patient was discharged, but re-admitted with recurrent  fever, abdominal pain, nausea, and vomiting.   LABORATORY DATA AND HOSPITAL COURSE:  The patient was admitted to the  hospital where a FUL workup was instituted.  A spiral CT scan of the chest  was done for slightly low oxygen saturations, but was negative for PE.  The  patient was seen in consultation by gastroenterology for Hemoccult positive  stool, elevated liver function studies, and abdominal pain.  The patient  underwent upper panendoscopy which revealed acute gastritis and some reflux  esophagitis.  Blood and urine cultures were negative.  The cultures from the  abdomen and uterus were also negative.  Serial laboratory studies revealed  persistent pancytopenia which was improving at the time of discharge.  Multiple studies, including extractable nuclear antigen, rheumatoid factor,  and serology for CMV,  HIV were pending at the time of discharge.  CT  angiogram of the chest with contrast revealed no evidence of PE and tiny  bilateral effusions, with some suggestion of some interstitial edema.  Transvaginal pelvic ultrasound revealed a normal-appearing uterus, normal  ovaries, and a tiny amount of free fluid in the pelvic cul-de-sac.  The  hospital course was marked steady improvement.  At the time of discharge,  she was pain-free, tolerating a diet, and had been afebrile for 48 hours.   DISPOSITION:  The patient will be discharged on the medical regimen listed  above.  She will slowly advance her diet.  She will return for office  followup next week.   CONDITION ON DISCHARGE:  Stable.     __    PFK/MEDQ  D:  06/01/2005  T:  06/01/2005  Job:  [id number]

## 2011-04-19 NOTE — Discharge Summary (Signed)
NAMECAYLEEN, BENJAMIN NO.:  [id number]   MEDICAL RECORD NO.:  [id number]          PATIENT TYPE:  INP   LOCATION:  9315                          FACILITY:  [facility]   PHYSICIAN:  Carrington Clamp, M.D. DATE OF BIRTH:  [birth year]   DATE OF ADMISSION:  05/24/2005  DATE OF DISCHARGE:  05/26/2005                                 DISCHARGE SUMMARY   ADMITTING DIAGNOSES:  Probable postoperative endometritis.   DISCHARGE DIAGNOSES:  Fever of unknown origin.   PERTINENT TESTS PERFORMED:  Laboratory evaluation for connective tissue  disorder, pelvic ultrasound which was essentially benign, and CT of the  abdomen and pelvis which revealed no abnormalities except for a small amount  of atelectasis or infiltrate in the left base of the lung.  Patient was  discharged on June 25.   Dictation ended at this point.      Carrington Clamp, M.D.  Electronically Signed     MH/MEDQ  D:  08/22/2005  T:  08/22/2005  Job:  [id number]

## 2011-04-19 NOTE — Op Note (Signed)
[facility] [place]  Patient:    SHALLA, Courtney Little                        MRN: [id number] Proc. Date: 06/16/01 Adm. Date:  [id number] Attending:  Wetzel Bjornstad                           Operative Report  PREOPERATIVE DIAGNOSIS:       Desiring permanent sterility.  POSTOPERATIVE DIAGNOSIS:      Desiring permanent sterility.  OPERATION/PROCEDURE:          Laparoscopic Falope tube ring application.  SURGEON:                      Katy Fitch, M.D.  ANESTHESIA:                   General.  ESTIMATED BLOOD LOSS:         Less than 10 cc.  URINE OUTPUT:                 One-hundred cc, clear.  FLUIDS:                       Crystalloid, 1100 cc.  COMPLICATIONS:                None.  SPECIMENS:                    None.  FINDINGS:                     Normal size uterus, with normal appearing tubes, ovaries, liver edge, bowel.  There were some sigmoid filmy adhesions of the left pelvic sidewall.  DESCRIPTION OF PROCEDURE:     After consent was obtained from the patient the patient was taken to the operating room, where general anesthesia was administered.  The patient was then prepped and draped in normal sterile fashion.  A catheter was inserted in the bladder and approximately 100 cc of clear urine return was noted.  A bivalve speculum was then placed in the vagina and a Hulka uterine manipulator was placed on the cervix without difficulty.  A horizontal skin incision was then made through previous incision.  A Veress needle was then insertion into the abdominal cavity after tenting up the abdominal wall manually.  CO2 gas was then instilled, with an opening pressure noted to be -1.  The abdominal cavity was inflated to approximately 14 mm Hg and the abdominal wall was taunt to percussion.  The Veress needle was then removed and a 10 mm trocar then placed into the umbilical incision after abdominal wall tenting was done.  The camera device was then  assembled and placed through the 10 mm port, and general inspection of the abdominal cavity was noted with the above findings.  The patient was then placed in Trendelenburg and the uterus was manipulated, and there was noted to be the left pelvic sidewall adhesion from prior surgery.  At this point, though, it was not interfering with the application of the fallopian tube ring and a 5 mm skin incision was then made suprapubically and a 5 mm port then placed down through the skin incision under direct visualization. The Falope-ring applicator was then placed on the left fallopian tube and the ring adhered at the isthmic ampullary junction, and blanching of the  tube was noted.  A ring was then placed in similar position on the right tube with blanching noted, and the rest of the abdominal cavity inspected.  There was no bleeding noted.  The suprapubic port was then removed under direct visualization without any bleed.  The carbon dioxide gas was then removed from the abdominal cavity as much as possible through the 10 mm port and the 10 mm port then removed.  The fascia was then grasped with Allis clamps and a 0 Vicryl suture was used to close the fascia.  The skin was closed using 3-0 Monocryl in subcuticular fashion.  The patient was taken to the recovery room in stable condition. DD:  06/16/01 TD:  06/16/01 Job: [id number]

## 2011-04-19 NOTE — Op Note (Signed)
NAME:  Courtney Little, Courtney Little NO.:  [id number]   MEDICAL RECORD NO.:  [id number]          PATIENT TYPE:  AMB   LOCATION:  SDC                           FACILITY:  [facility]   PHYSICIAN:  Carrington Clamp, M.D. DATE OF BIRTH:  Apr 06, 1971   DATE OF PROCEDURE:  05/21/2005  DATE OF DISCHARGE:                                 OPERATIVE REPORT   PREOPERATIVE DIAGNOSIS:  Menometrorrhagia.   POSTOPERATIVE DIAGNOSIS:  Endometrial polyps.   OPERATION/PROCEDURE:  Dilatation and curettage after hysteroscopy with  NovaSure ablation.   SURGEON:  Carrington Clamp, M.D.   ASSISTANT:  None.   ANESTHESIA:  General.   SPECIMENS:  Uterine curettings.   ESTIMATED BLOOD LOSS:  Minimal.   IV FLUIDS:  1400 mL.   URINARY OUTPUT:  Not measured.   COMPLICATIONS:  None.   FINDINGS:  About a nine-week size uterus.  Cavity length was 5.5 cm which  was derived from the sound of 9 cm and a cervical length of 3.5 cm.  Width  was 4.4 cm.  Power was 133 watts and the NovaSure ablation took 58 seconds.   MEDICATIONS:  1% Xylocaine to cervix.   COUNTS:  Correct x3.   DESCRIPTION OF PROCEDURE:  After adequate general anesthesia was achieved as  the patient was prepped and draped in the sterile fashion in the dorsal  lithotomy position.  A catheter was used to empty the bladder and then a  speculum was placed in the vagina.  The cervix was grasped with single-tooth  tenaculum and cervix was dilated up with Shawnie Pons dilators.  Before the full  dilation though, a Hanks dilator was used to measure the cervical length at  3.5 cm.  Hysteroscope was passed easily into the uterus and shaggy  endometrium with some posterior fundal polyps were noted.  Vigorous dilation  and curettage was performed as well as polypectomy in all tissue sent to  pathology.  Hysteroscope was passed in the cavity again and the endometrium  was slightly shaggy but the polyps were gone.  The NovaSure ablation  instrument was then  placed into the cavity after the uterus had been sounded  and the cervical length double checked with the hysteroscope.  The NovaSure  ablation instrument was set for a cavity length of 5.5 cm.  On first attempt  to seat, the array would not deploy further than 2 cm so it was carefully  removed, redeployed after pulling back against the cervix and seated gently  into the fundus.  After the seating maneuvers were performed, the cavity  width was measured at 4.4 cm.  The first attempt at cavity assessment  failed.  However, after simply holding the collar of the instrument close to  the cervix, the cavity assessment passed very easily on the second try.  The  ablation instrument was set with the above parameters and the ablation was  performed over 58 seconds.  The instrument was carefully removed and the  hysteroscope passed into the uterine cavity to insure coverage of all the  areas which were seen.  The uterus was intact.  All instruments were then  withdrawn from the vagina.  The tenaculum site was held pressure with 4 x 4  on ring forceps.  Once hemostasis was achieved, everything was withdrawn  from the vagina.  The patient tolerated the procedure well and returned to  recovery in stable condition.       MH/MEDQ  D:  05/21/2005  T:  05/21/2005  Job:  [id number]

## 2011-04-19 NOTE — Op Note (Signed)
NAME:  Courtney Little Little NO.:  [id number]   MEDICAL RECORD NO.:  [id number]          PATIENT TYPE:  MAT   LOCATION:  MATC                          FACILITY:  [facility]   PHYSICIAN:  Carrington Clamp, M.D. DATE OF BIRTH:  1971/07/30   DATE OF PROCEDURE:  05/28/2005  DATE OF DISCHARGE:                                 OPERATIVE REPORT   PREOPERATIVE DIAGNOSES:  Right lower quadrant pain, fever, and nausea and  vomiting of unknown origin.   POSTOPERATIVE DIAGNOSES:  One week of NovaSure endometrial ablation,  possible pyometritis.   PROCEDURE:  Diagnostic laparoscopy.   SURGEON:  Dr. Henderson Cloud   ASSISTANT:  Dr. Tenny Craw   ANESTHESIA:  General.   SPECIMENS:  Culture of pelvic fluid, both aerobic and anaerobic, culture of  uterus both aerobic and anaerobic.   ESTIMATED BLOOD LOSS:  Minimal.   IV FLUIDS:  1000 mL.   URINE OUTPUT:  225 mL.   COMPLICATIONS:  None.   FINDINGS:  A slightly boggy uterus but incompletely intact.  There were no  lesions, adhesions, lacerations, or ruptures of the uterus.  Tubes were seen  bilaterally with bands on them, and there were no signs of adhesions,  lacerations, pyo or hydrosalpinges, and fimbria were completely normal.  Right ovary was slightly enlarged with a simple-appearing cyst.  There were  no lesions, pus, or adhesions.  Left ovary is completely normal; again, no  lesions, pus, or adhesions.  Small amount of endometritis in cul-de-sac were  seen but no adhesions or pus;  there was only clear fluid which was sampled.  A small amount of endometriosis was seen on the left anterior peritoneum  with no adhesions.  There was a small adhesion of descending sigmoid to  anterior abdominal wall.  There was no pus or erythema in this area.  Appendix was seen almost to tip.  This appeared pale, normal in size, normal  caliber, no erythema, and no adhesions of the body but I was unable to see  all the Little to the tip of the appendix.  There  was a small amount  of  erythema in the area of the appendix but no pus or adhesions of the appendix  to this area.  There was a small adhesion of the bowel to the anterior  abdominal wall in this area but appeared to be without pus.  Bowel was  grossly normal without erythema, adhesions, pus, or bleeding.  Liver edge  was clear without adhesions, pus, or blood.   MEDICATIONS:  None.   COUNTS:  Correct x 3.   REASON FOR OPERATION:  Courtney Little Little had underwent a completely uncomplicated  endometrial ablation with NovaSure, including a D&C and hysteroscopy one  weel ago today.  Hysteroscopy had been performed post the NovaSure ablation,  and the uterus was completely intact.  She went home without any  complications or complaints until Friday, when she woke up with fever and  chills.  She also had nausea, vomiting, and lower abdominal pain.  On exam,  she had a tender uterus and had a temperature  of 102.  The patient was  admitted for presumed endometritis postop and started on Unasyn.  The  patient also had a CT which showed normal bowel planes, normal tissue  planes, no masses or abnormalities of the pelvis, and only a small amount of  fluid in the cul-de-sac.  The patient began to defervesce by the 2nd day of  admission on Sunday and was discharged to home on Augmentin antibiotics.  Both her pain and her nausea and vomiting were much better at this point.  The patient was okay on Sunday night and on Monday; however, she woke up on  Tuesday morning again with fever and chills and severe nausea and vomiting  as well as  right lower quadrant pain.  The patient was seen in the  hospital, and repeat CT showed no change.  Pelvic ultrasound confirmed this.  White blood cell count was still low at 3; however, the pelvic exam showing  marked tenderness on the right-hand side, cervical motion tenderness; she  had a  fever of 102 again.  A culture had been taken of the uterine  discharge on  Friday, and that had been read out as nonspecific growth.  The  patient had two sets of blood cultures taken today, liver function tests  which were slightly elevated but not tremendously elevated, and otherwise  normal labs and normal urine.  The patient was counseled for a diagnostic  laparoscopy and pertinent procedures should there be a large amount of  infection or appendicitis in the abdomen.  The patient understood the risks  and benefits of the operation and underwent the following operation.   TECHNIQUE:  After adequate general anesthesia was achieved, the patient was  prepped and draped in the usual sterile fashion in the dorsolithotomy  position.  A speculum was placed in the vagina and a single-tooth tenaculum  used to grasp the cervix.  The Hasson cannula was placed inside the cervix,  and a Foley had drained the bladder.  Attention was then turned to the  abdomen where a 1 cm infraumbilical incision was made with the scalpel and  opened up slightly with the hemostat.  The Veress needle was passed into the  abdominal cavity without aspiration of bowel contents or blood and abdomen  insufflated.  The plastic direct visualization trocar was entered into, and  each layer of the abdomen could be seen as it was incised with the plastic  trocar.  Peritoneum was entered into without complication and the camera  replaced.  A second incision suprapubically was made with the scalpel and a  5 mm placed through this.   A probe was entered into the abdomen, and a careful examination of all  tissue surfaces that could be seen was performed.  A syringe extension was  placed in the abdomen and a small amount of peritoneal fluid was taken up  for culture.  After careful inspection of all surfaces, attention was then  turned to the vagina where the cannula was removed, and the cervix was dilated with Shawnie Pons dilators.  Aerobic and anaerobic cultures were taken of  the inside of the uterus and  sent off.  After changing gloves, attention was  then turned back to the abdomen where the uterus was reinspected and found  to be completely intact still.  All instruments were withdrawn from the  abdomen and the abdomen desufflated.  The 10 mm trocar site fascia was  closed with a figure-of-eight stitch of 2-0 Vicryl.  The 5 mm trocar skin  was closed with a stitch of 3-0 Vicryl Rapide.  The skin incisions were  closed with Dermabond.  All instruments were withdrawn from the vagina, and  the patient was returned to the recovery room in stable condition.       MH/MEDQ  D:  05/28/2005  T:  05/28/2005  Job:  [id number]

## 2011-09-06 LAB — CBC
HCT: 40.8
MCHC: 35
MCV: 90.9
Platelets: 158
RBC: 4.49
WBC: 8.3

## 2011-09-06 LAB — COMPREHENSIVE METABOLIC PANEL
ALT: 64 — ABNORMAL HIGH
Alkaline Phosphatase: 77
CO2: 25
Calcium: 8.4
Creatinine, Ser: 0.9
GFR calc non Af Amer: >60
Total Bilirubin: 0.8

## 2011-09-06 LAB — DIFFERENTIAL
Lymphocytes Relative: 12
Lymphs Abs: 1
Monocytes Relative: 5

## 2011-09-06 LAB — URINALYSIS, ROUTINE W REFLEX MICROSCOPIC
Bilirubin Urine: NEGATIVE
Hgb urine dipstick: NEGATIVE
Nitrite: NEGATIVE
Protein, ur: NEGATIVE
Specific Gravity, Urine: 1.025
Urobilinogen, UA: 0.2

## 2011-09-06 LAB — WET PREP, GENITAL
Clue Cells Wet Prep HPF POC: NONE SEEN
Yeast Wet Prep HPF POC: NONE SEEN

## 2011-09-06 LAB — GC/CHLAMYDIA PROBE AMP, GENITAL: Chlamydia, DNA Probe: NEGATIVE

## 2012-04-17 ENCOUNTER — Ambulatory Visit (INDEPENDENT_AMBULATORY_CARE_PROVIDER_SITE_OTHER): Payer: Commercial Managed Care - PPO | Admitting: Family Medicine

## 2012-04-17 ENCOUNTER — Encounter: Payer: Self-pay | Admitting: Family Medicine

## 2012-04-17 VITALS — BP 120/84 | Temp 98.2°F | Ht 71.0 in | Wt 314.0 lb

## 2012-04-17 DIAGNOSIS — R599 Enlarged lymph nodes, unspecified: Secondary | ICD-10-CM

## 2012-04-17 DIAGNOSIS — M1990 Unspecified osteoarthritis, unspecified site: Secondary | ICD-10-CM

## 2012-04-17 DIAGNOSIS — E669 Obesity, unspecified: Secondary | ICD-10-CM | POA: Insufficient documentation

## 2012-04-17 DIAGNOSIS — R590 Localized enlarged lymph nodes: Secondary | ICD-10-CM | POA: Insufficient documentation

## 2012-04-17 DIAGNOSIS — E28319 Asymptomatic premature menopause: Secondary | ICD-10-CM

## 2012-04-17 NOTE — Patient Instructions (Signed)
Motrin 600 mg twice daily with food for joint pain  Return when necessary  Continue the diet exercise in gym

## 2012-04-17 NOTE — Progress Notes (Signed)
  Subjective:    Patient ID: Courtney Little, female    DOB: Jan 27, 1971, 41 y.o.   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 She's also had for the past 6 months and symmetrical joint pain no edema however her mother had osteoarthritis and her grandmother had rheumatoid arthritis  She bruised her left leg at work around Thanksgiving last fall the stretcher hit her and she has a persistent bruise on that lower extremity.  Her weight is still problematic 314 pounds but she is trying to go to the gym on a regular basis  8 years ago she had an endometrial ablation and has no periods 3 years ago she had her right ovary removed it was cystic now she's having symptoms of menopause hot flashes. She had a Pap by her GYN last fall which was normal.   Review of Systems    general review of systems otherwise negative Objective:   Physical Exam  Well-developed well-nourished female in no acute distress examination of the lymph nodes in the axillary shows 2 small persistent nodes but the big nodes that she describes have disappeared joints are normal skin is normal a persistent bruise left medial b is from contusion in the emergency room in August process      Assessment & Plan:  Healthy female  Axillar adenopathy secondary to shaving reassured  Osteoarthritis Motrin 600 twice a day when necessary  Bruised left lower extremity observe  Premature menopause observe  Obesity continue diet exercise and weight loss

## 2014-11-18 LAB — HM MAMMOGRAPHY: HM MAMMO: NORMAL

## 2014-11-23 ENCOUNTER — Encounter: Payer: Self-pay | Admitting: Family Medicine

## 2014-12-06 ENCOUNTER — Encounter: Payer: Self-pay | Admitting: Family Medicine

## 2015-03-31 ENCOUNTER — Ambulatory Visit (INDEPENDENT_AMBULATORY_CARE_PROVIDER_SITE_OTHER): Payer: 59 | Admitting: Adult Health

## 2015-03-31 ENCOUNTER — Encounter: Payer: Self-pay | Admitting: Adult Health

## 2015-03-31 VITALS — BP 122/82 | Temp 98.3°F | Wt 318.1 lb

## 2015-03-31 DIAGNOSIS — J029 Acute pharyngitis, unspecified: Secondary | ICD-10-CM | POA: Diagnosis not present

## 2015-03-31 LAB — POCT RAPID STREP A (OFFICE): RAPID STREP A SCREEN: NEGATIVE

## 2015-03-31 MED ORDER — PREDNISONE 20 MG PO TABS: 20.0000 mg | ORAL_TABLET | Freq: Every day | ORAL | Status: DC

## 2015-03-31 NOTE — Progress Notes (Signed)
Pre visit review using our clinic review tool, if applicable. No additional management support is needed unless otherwise documented below in the visit note. 

## 2015-03-31 NOTE — Patient Instructions (Addendum)
Your strep test was negative. Your sore throat was likely viral and was exacerbated by an allergic reaction of some type. I have prescribed a week of additional steroids to prevent any rebound swelling. Follow up to establish care and get a complete physical.   Taper the steroids 60,50,40,30,20,10,10 throughout the week. Sore Throat A sore throat is pain, burning, irritation, or scratchiness of the throat. There is often pain or tenderness when swallowing or talking. A sore throat may be accompanied by other symptoms, such as coughing, sneezing, fever, and swollen neck glands. A sore throat is often the first sign of another sickness, such as a cold, flu, strep throat, or mononucleosis (commonly known as mono). Most sore throats go away without medical treatment. CAUSES  The most common causes of a sore throat include:  A viral infection, such as a cold, flu, or mono.  A bacterial infection, such as strep throat, tonsillitis, or whooping cough.  Seasonal allergies.  Dryness in the air.  Irritants, such as smoke or pollution.  Gastroesophageal reflux disease (GERD). HOME CARE INSTRUCTIONS   Only take over-the-counter medicines as directed by your caregiver.  Drink enough fluids to keep your urine clear or pale yellow.  Rest as needed.  Try using throat sprays, lozenges, or sucking on hard candy to ease any pain (if older than 4 years or as directed).  Sip warm liquids, such as broth, herbal tea, or warm water with honey to relieve pain temporarily. You may also eat or drink cold or frozen liquids such as frozen ice pops.  Gargle with salt water (mix 1 tsp salt with 8 oz of water).  Do not smoke and avoid secondhand smoke.  Put a cool-mist humidifier in your bedroom at night to moisten the air. You can also turn on a hot shower and sit in the bathroom with the door closed for 5-10 minutes. SEEK IMMEDIATE MEDICAL CARE IF:  You have difficulty breathing.  You are unable to swallow  fluids, soft foods, or your saliva.  You have increased swelling in the throat.  Your sore throat does not get better in 7 days.  You have nausea and vomiting.  You have a fever or persistent symptoms for more than 2-3 days.  You have a fever and your symptoms suddenly get worse. MAKE SURE YOU:   Understand these instructions.  Will watch your condition.  Will get help right away if you are not doing well or get worse. Document Released: 12/26/2004 Document Revised: 11/04/2012 Document Reviewed: 07/26/2012 [facility] Patient Information 2015 [employer], [place]. This information is not intended to replace advice given to you by your health care provider. Make sure you discuss any questions you have with your health care provider.

## 2015-03-31 NOTE — Progress Notes (Signed)
   Subjective:    Patient ID: Courtney Little, female    DOB: 11/16/1971, 44 y.o.   MRN: [id number]  HPI Ms. Courtney Little presents to the office today follow urgent care visit which sounds as though she had an allergic reaction with angio edema.   On Monday she had a sore throat and her ear hurt, she slept most of the day. When she woke up on Tuesday her throat felt as though " I was swallowing glass and she had a headache. Wednesday she noticed white patches on her throat and went to UC after work. While at urgent care she felt as though her throat was closing up and she had trouble breathing. She endorses that her neck became swollen. [facility] the difficulty breathing and swelling. [facility]   Today she endorses continued sore throat although it is not as bad with slight ear pain. Denies fever today but had low grade fever through out the week.    Review of Systems  HENT: Positive for ear pain and sore throat. Negative for congestion, hearing loss, rhinorrhea and tinnitus.   Eyes: Negative.   Respiratory: Negative.   Cardiovascular: Positive for palpitations.  Hematological: Positive for adenopathy.  All other systems reviewed and are negative.      Objective:   Physical Exam  Constitutional: She is oriented to person, place, and time. She appears well-developed and well-nourished. No distress.  HENT:  Head: Normocephalic and atraumatic.  Right Ear: External ear normal.  Left Ear: External ear normal.  Mouth/Throat: Oropharynx is clear and moist. No oropharyngeal exudate.  Neck: Normal range of motion. Neck supple.  Cardiovascular: Normal rate, regular rhythm, normal heart sounds and intact distal pulses.  Exam reveals no gallop and no friction rub.   No murmur heard. Pulmonary/Chest: Effort normal and breath sounds normal. No respiratory distress. She has no wheezes. She has no rales. She exhibits no tenderness.    Musculoskeletal: Normal range of motion.  Lymphadenopathy:    She has cervical adenopathy.  Neurological: She is alert and oriented to person, place, and time.  Skin: Skin is warm and dry. No rash noted. No erythema. No pallor.  Psychiatric: She has a normal mood and affect. Her behavior is normal. Judgment and thought content normal.  Vitals reviewed.      Assessment & Plan:  1. Sore throat - POCT rapid strep A- negative - Steroid taper for seven days to prevent rebound swelling.  - Follow up in 3-4 weeks for complete physical  - Follow up sooner if needed.

## 2015-04-14 ENCOUNTER — Encounter: Payer: Self-pay | Admitting: Adult Health

## 2015-04-14 ENCOUNTER — Ambulatory Visit (INDEPENDENT_AMBULATORY_CARE_PROVIDER_SITE_OTHER): Payer: 59 | Admitting: Adult Health

## 2015-04-14 ENCOUNTER — Encounter: Payer: 59 | Admitting: Adult Health

## 2015-04-14 ENCOUNTER — Other Ambulatory Visit (INDEPENDENT_AMBULATORY_CARE_PROVIDER_SITE_OTHER): Payer: 59

## 2015-04-14 ENCOUNTER — Other Ambulatory Visit (HOSPITAL_COMMUNITY)
Admission: RE | Admit: 2015-04-14 | Discharge: 2015-04-14 | Disposition: A | Discharge status: Home / Self Care | Payer: 59 | Source: Ambulatory Visit | Attending: Adult Health | Admitting: Adult Health

## 2015-04-14 VITALS — BP 124/84 | Temp 98.1°F | Ht 69.0 in | Wt 312.7 lb

## 2015-04-14 DIAGNOSIS — Z1151 Encounter for screening for human papillomavirus (HPV): Secondary | ICD-10-CM | POA: Insufficient documentation

## 2015-04-14 DIAGNOSIS — Z01419 Encounter for gynecological examination (general) (routine) without abnormal findings: Secondary | ICD-10-CM | POA: Insufficient documentation

## 2015-04-14 DIAGNOSIS — Z0000 Encounter for general adult medical examination without abnormal findings: Secondary | ICD-10-CM

## 2015-04-14 DIAGNOSIS — E669 Obesity, unspecified: Secondary | ICD-10-CM | POA: Diagnosis not present

## 2015-04-14 DIAGNOSIS — R87810 Cervical high risk human papillomavirus (HPV) DNA test positive: Secondary | ICD-10-CM | POA: Insufficient documentation

## 2015-04-14 LAB — BASIC METABOLIC PANEL
BUN: 17 mg/dL (ref 6–23)
CALCIUM: 9.7 mg/dL (ref 8.4–10.5)
CO2: 27 mEq/L (ref 19–32)
CREATININE: 1.02 mg/dL (ref 0.40–1.20)
Chloride: 103 mEq/L (ref 96–112)
GFR: 63.08 mL/min (ref >60.00)
GLUCOSE: 98 mg/dL (ref 70–99)
POTASSIUM: 4 meq/L (ref 3.5–5.1)
SODIUM: 136 meq/L (ref 135–145)

## 2015-04-14 LAB — CBC WITH DIFFERENTIAL/PLATELET
BASOS ABS: 0 10*3/uL (ref 0.0–0.1)
BASOS PCT: 0.5 % (ref 0.0–3.0)
EOS ABS: 0.1 10*3/uL (ref 0.0–0.7)
EOS PCT: 0.8 % (ref 0.0–5.0)
HEMATOCRIT: 45.8 % (ref 36.0–46.0)
Hemoglobin: 15.7 g/dL — ABNORMAL HIGH (ref 12.0–15.0)
LYMPHS ABS: 1.9 10*3/uL (ref 0.7–4.0)
LYMPHS PCT: 25.2 % (ref 12.0–46.0)
MCHC: 34.3 g/dL (ref 30.0–36.0)
MCV: 90.5 fl (ref 78.0–100.0)
MONO ABS: 0.6 10*3/uL (ref 0.1–1.0)
MONOS PCT: 8.2 % (ref 3.0–12.0)
NEUTROS PCT: 65.3 % (ref 43.0–77.0)
Neutro Abs: 5 10*3/uL (ref 1.4–7.7)
Platelets: 186 10*3/uL (ref 150.0–400.0)
RBC: 5.06 Mil/uL (ref 3.87–5.11)
RDW: 13.4 % (ref 11.5–15.5)
WBC: 7.7 10*3/uL (ref 4.0–10.5)

## 2015-04-14 LAB — LIPID PANEL
CHOL/HDL RATIO: 3
CHOLESTEROL: 170 mg/dL (ref 0–200)
HDL: 52.3 mg/dL (ref >39.00)
LDL CALC: 97 mg/dL (ref 0–99)
NONHDL: 117.7
Triglycerides: 104 mg/dL (ref 0.0–149.0)
VLDL: 20.8 mg/dL (ref 0.0–40.0)

## 2015-04-14 LAB — HEPATIC FUNCTION PANEL
ALBUMIN: 4 g/dL (ref 3.5–5.2)
ALK PHOS: 54 U/L (ref 39–117)
ALT: 26 U/L (ref 0–35)
AST: 20 U/L (ref 0–37)
BILIRUBIN DIRECT: 0.1 mg/dL (ref 0.0–0.3)
BILIRUBIN TOTAL: 0.8 mg/dL (ref 0.2–1.2)
TOTAL PROTEIN: 7.1 g/dL (ref 6.0–8.3)

## 2015-04-14 LAB — POCT URINALYSIS DIPSTICK
Glucose, UA: NEGATIVE
Nitrite, UA: NEGATIVE
PH UA: 5.5
Spec Grav, UA: >=1.03
Urobilinogen, UA: 0.2

## 2015-04-14 LAB — C-REACTIVE PROTEIN: CRP: 0.1 mg/dL — ABNORMAL LOW (ref 0.5–20.0)

## 2015-04-14 LAB — RHEUMATOID FACTOR

## 2015-04-14 LAB — HEMOGLOBIN A1C: Hgb A1c MFr Bld: 5.4 % (ref 4.6–6.5)

## 2015-04-14 LAB — TSH: TSH: 0.86 u[IU]/mL (ref 0.35–4.50)

## 2015-04-14 NOTE — Patient Instructions (Addendum)
It was great seeing you again today. Please follow up with your gynocologist for a complete vaginal exam. Continue to work on diet, exercise and weight loss. I will let you know what your blood work shows. You can follow up with me in a year for your next physical or sooner if you need anything. Have a great vacation at the beach, you deserve a some rest and relaxation.   Health Maintenance Adopting a healthy lifestyle and getting preventive care can go a long way to promote health and wellness. Talk with your health care provider about what schedule of regular examinations is right for you. This is a good chance for you to check in with your provider about disease prevention and staying healthy. In between checkups, there are plenty of things you can do on your own. Experts have done a lot of research about which lifestyle changes and preventive measures are most likely to keep you healthy. Ask your health care provider for more information. WEIGHT AND DIET  Eat a healthy diet  Be sure to include plenty of vegetables, fruits, low-fat dairy products, and lean protein.  Do not eat a lot of foods high in solid fats, added sugars, or salt.  Get regular exercise. This is one of the most important things you can do for your health.  Most adults should exercise for at least 150 minutes each week. The exercise should increase your heart rate and make you sweat (moderate-intensity exercise).  Most adults should also do strengthening exercises at least twice a week. This is in addition to the moderate-intensity exercise.  Maintain a healthy weight  Body mass index (BMI) is a measurement that can be used to identify possible weight problems. It estimates body fat based on height and weight. Your health care provider can help determine your BMI and help you achieve or maintain a healthy weight.  For females 49 years of age and older:   A BMI below 18.5 is considered underweight.  A BMI of 18.5 to  24.9 is normal.  A BMI of 25 to 29.9 is considered overweight.  A BMI of 30 and above is considered obese.  Watch levels of cholesterol and blood lipids  You should start having your blood tested for lipids and cholesterol at 42 years of age, then have this test every 5 years.  You may need to have your cholesterol levels checked more often if:  Your lipid or cholesterol levels are high.  You are older than 42 years of age.  You are at high risk for heart disease.  CANCER SCREENING   Lung Cancer  Lung cancer screening is recommended for adults 66-66 years old who are at high risk for lung cancer because of a history of smoking.  A yearly low-dose CT scan of the lungs is recommended for people who:  Currently smoke.  Have quit within the past 15 years.  Have at least a 30-pack-year history of smoking. A pack year is smoking an average of one pack of cigarettes a day for 1 year.  Yearly screening should continue until it has been 15 years since you quit.  Yearly screening should stop if you develop a health problem that would prevent you from having lung cancer treatment.  Breast Cancer  Practice breast self-awareness. This means understanding how your breasts normally appear and feel.  It also means doing regular breast self-exams. Let your health care provider know about any changes, no matter how small.  If you are  in your 24s or 30s, you should have a clinical breast exam (CBE) by a health care provider every 1-3 years as part of a regular health exam.  If you are 9 or older, have a CBE every year. Also consider having a breast X-ray (mammogram) every year.  If you have a family history of breast cancer, talk to your health care provider about genetic screening.  If you are at high risk for breast cancer, talk to your health care provider about having an MRI and a mammogram every year.  Breast cancer gene (BRCA) assessment is recommended for women who have family  members with BRCA-related cancers. BRCA-related cancers include:  Breast.  Ovarian.  Tubal.  Peritoneal cancers.  Results of the assessment will determine the need for genetic counseling and BRCA1 and BRCA2 testing. Cervical Cancer Routine pelvic examinations to screen for cervical cancer are no longer recommended for nonpregnant women who are considered low risk for cancer of the pelvic organs (ovaries, uterus, and vagina) and who do not have symptoms. A pelvic examination may be necessary if you have symptoms including those associated with pelvic infections. Ask your health care provider if a screening pelvic exam is right for you.   The Pap test is the screening test for cervical cancer for women who are considered at risk.  If you had a hysterectomy for a problem that was not cancer or a condition that could lead to cancer, then you no longer need Pap tests.  If you are older than 65 years, and you have had normal Pap tests for the past 10 years, you no longer need to have Pap tests.  If you have had past treatment for cervical cancer or a condition that could lead to cancer, you need Pap tests and screening for cancer for at least 20 years after your treatment.  If you no longer get a Pap test, assess your risk factors if they change (such as having a new sexual partner). This can affect whether you should start being screened again.  Some women have medical problems that increase their chance of getting cervical cancer. If this is the case for you, your health care provider may recommend more frequent screening and Pap tests.  The human papillomavirus (HPV) test is another test that may be used for cervical cancer screening. The HPV test looks for the virus that can cause cell changes in the cervix. The cells collected during the Pap test can be tested for HPV.  The HPV test can be used to screen women 79 years of age and older. Getting tested for HPV can extend the interval  between normal Pap tests from three to five years.  An HPV test also should be used to screen women of any age who have unclear Pap test results.  After 42 years of age, women should have HPV testing as often as Pap tests.  Colorectal Cancer  This type of cancer can be detected and often prevented.  Routine colorectal cancer screening usually begins at 42 years of age and continues through 42 years of age.  Your health care provider may recommend screening at an earlier age if you have risk factors for colon cancer.  Your health care provider may also recommend using home test kits to check for hidden blood in the stool.  A small camera at the end of a tube can be used to examine your colon directly (sigmoidoscopy or colonoscopy). This is done to check for the earliest forms  of colorectal cancer.  Routine screening usually begins at age 33.  Direct examination of the colon should be repeated every 5-10 years through 42 years of age. However, you may need to be screened more often if early forms of precancerous polyps or small growths are found. Skin Cancer  Check your skin from head to toe regularly.  Tell your health care provider about any new moles or changes in moles, especially if there is a change in a mole's shape or color.  Also tell your health care provider if you have a mole that is larger than the size of a pencil eraser.  Always use sunscreen. Apply sunscreen liberally and repeatedly throughout the day.  Protect yourself by wearing long sleeves, pants, a wide-brimmed hat, and sunglasses whenever you are outside. HEART DISEASE, DIABETES, AND HIGH BLOOD PRESSURE   Have your blood pressure checked at least every 1-2 years. High blood pressure causes heart disease and increases the risk of stroke.  If you are between 89 years and 59 years old, ask your health care provider if you should take aspirin to prevent strokes.  Have regular diabetes screenings. This involves  taking a blood sample to check your fasting blood sugar level.  If you are at a normal weight and have a low risk for diabetes, have this test once every three years after 42 years of age.  If you are overweight and have a high risk for diabetes, consider being tested at a younger age or more often. PREVENTING INFECTION  Hepatitis B  If you have a higher risk for hepatitis B, you should be screened for this virus. You are considered at high risk for hepatitis B if:  You were born in a country where hepatitis B is common. Ask your health care provider which countries are considered high risk.  Your parents were born in a high-risk country, and you have not been immunized against hepatitis B (hepatitis B vaccine).  You have HIV or AIDS.  You use needles to inject street drugs.  You live with someone who has hepatitis B.  You have had sex with someone who has hepatitis B.  You get hemodialysis treatment.  You take certain medicines for conditions, including cancer, organ transplantation, and autoimmune conditions. Hepatitis C  Blood testing is recommended for:  Everyone born from [birth year] through 1963.  Anyone with known risk factors for hepatitis C. Sexually transmitted infections (STIs)  You should be screened for sexually transmitted infections (STIs) including gonorrhea and chlamydia if:  You are sexually active and are younger than 42 years of age.  You are older than 42 years of age and your health care provider tells you that you are at risk for this type of infection.  Your sexual activity has changed since you were last screened and you are at an increased risk for chlamydia or gonorrhea. Ask your health care provider if you are at risk.  If you do not have HIV, but are at risk, it may be recommended that you take a prescription medicine daily to prevent HIV infection. This is called pre-exposure prophylaxis (PrEP). You are considered at risk if:  You are sexually active  and do not regularly use condoms or know the HIV status of your partner(s).  You take drugs by injection.  You are sexually active with a partner who has HIV. Talk with your health care provider about whether you are at high risk of being infected with HIV. If you choose to begin  PrEP, you should first be tested for HIV. You should then be tested every 3 months for as long as you are taking PrEP.  PREGNANCY   If you are premenopausal and you may become pregnant, ask your health care provider about preconception counseling.  If you may become pregnant, take 400 to 800 micrograms (mcg) of folic acid every day.  If you want to prevent pregnancy, talk to your health care provider about birth control (contraception). OSTEOPOROSIS AND MENOPAUSE   Osteoporosis is a disease in which the bones lose minerals and strength with aging. This can result in serious bone fractures. Your risk for osteoporosis can be identified using a bone density scan.  If you are 15 years of age or older, or if you are at risk for osteoporosis and fractures, ask your health care provider if you should be screened.  Ask your health care provider whether you should take a calcium or vitamin D supplement to lower your risk for osteoporosis.  Menopause may have certain physical symptoms and risks.  Hormone replacement therapy may reduce some of these symptoms and risks. Talk to your health care provider about whether hormone replacement therapy is right for you.  HOME CARE INSTRUCTIONS   Schedule regular health, dental, and eye exams.  Stay current with your immunizations.   Do not use any tobacco products including cigarettes, chewing tobacco, or electronic cigarettes.  If you are pregnant, do not drink alcohol.  If you are breastfeeding, limit how much and how often you drink alcohol.  Limit alcohol intake to no more than 1 drink per day for nonpregnant women. One drink equals 12 ounces of beer, 5 ounces of wine,  or 1 ounces of hard liquor.  Do not use street drugs.  Do not share needles.  Ask your health care provider for help if you need support or information about quitting drugs.  Tell your health care provider if you often feel depressed.  Tell your health care provider if you have ever been abused or do not feel safe at home. Document Released: 06/03/2011 Document Revised: 04/04/2014 Document Reviewed: 10/20/2013 [facility] Patient Information 2015 [place], [place]. This information is not intended to replace advice given to you by your health care provider. Make sure you discuss any questions you have with your health care provider.

## 2015-04-14 NOTE — Progress Notes (Signed)
 HPI:  Ms. Depaolo  presents today for complete physical.. I reviewed all health maintenance protocols including mammography, colonoscopy, bone density Needed referrals were placed. Age and diagnosis  appropriate screening labs were ordered. Her immunization history was reviewed and appropriate vaccinations were ordered. Her current medications and allergies were reviewed and needed refills of her chronic medications were ordered. The plan for yearly health maintenance was discussed all orders and referrals were made as appropriate.  Last PCP and physical:2008 Immunizations: UTD Diet:Some fried foods, lots of veggies. Eats two meals a day.   Exercise:"When I have time." She works 5-5 x 4 days a week Colonoscopy:Had one 44 y/o to r/o chrones - ended up being gallsbladder - was removed Pap Smear: "years" Mammogram: Dec 2015  Has the following chronic problems that require follow up and concerns today:  Endorses having sore joints.   Endorses having occasional palpitations, last was a month ago.   ROS negative for unless reported above: fevers, unintentional weight loss, hearing or vision loss, chest pain, palpitations, struggling to breath, hemoptysis, melena, hematochezia, hematuria, falls, loc, si, thoughts of self harm  No past medical history on file.  Past Surgical History  Procedure Laterality Date  . Endometrial ablation    . Right ovary removed      Family History  Problem Relation Age of Onset  . Arthritis Mother     Alive  . Arthritis Maternal Grandmother   . Diabetes Mother   . Diabetes Father     Alive  . Hypertension Father     History   Social History  . Marital Status: Single    Spouse Name: N/A  . Number of Children: N/A  . Years of Education: N/A   Social History Main Topics  . Smoking status: Never Smoker   . Smokeless tobacco: Not on file  . Alcohol Use: No  . Drug Use: No  . Sexual Activity: Not on file   Other Topics Concern  . 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    One dog and two cats   Beach and baseball likes to go to concerts     Current outpatient prescriptions:  .  Bioflavonoid Products (VITAMIN C) CHEW, Chew by mouth., Disp: , Rfl:  .  Biotin 10 MG TABS, Take by mouth., Disp: , Rfl:  .  EVENING PRIMROSE OIL PO, Take by mouth., Disp: , Rfl:  .  fish oil-omega-3 fatty acids 1000 MG capsule, Take 1 g by mouth daily., Disp: , Rfl:   EXAM:  Filed Vitals:   04/14/15 0804  BP: 124/84  Temp: 98.1 F (36.7 C)    Body mass index is 46.16 kg/(m^2).  GENERAL: vitals reviewed and listed above, alert, oriented, appears well hydrated and in no acute distress. She is obese   HEENT: atraumatic, conjunttiva clear, no obvious abnormalities on inspection of external nose and ears. Mild cerumen in ear canal  NECK: no obvious masses on inspection. Thyroid not enlarged.   LUNGS: clear to auscultation bilaterally, no wheezes, rales or rhonchi, good air movement  CV: HRRR, no peripheral edema. No carotid bruit, no lower leg claudication  MS: moves all extremities without noticeable abnormality. No edema noted  Abd: soft/nontender/nondistended/normal bowel sounds   Skin: warm and dry, no rash  Vaginal: Deferred. She did a self pap in the office. Was not comfortable with this NP doing the exam.   Breast: WNL. No lumps, dimpling, or discharge    Neuro: CN II-XII intact, sensation and reflexes normal throughout, 5/5 muscle strength in bilateral upper and lower extremities.   PSYCH: pleasant and cooperative, no obvious depression or anxiety  ASSESSMENT AND PLAN:  Discussed the following assessment and plan:  1. Routine general medical examination at a health care facility - Basic metabolic panel - Hemoglobin A1c - Hepatic function panel - Lipid panel - TSH - POCT urinalysis dipstick - CBC with Differential/Platelet  1. Routine general medical examination at a health care  facility - Basic metabolic panel - Hemoglobin A1c - Hepatic function panel - Lipid panel - TSH - POCT urinalysis dipstick - CBC with Differential/Platelet - Cytology - PAP  2. Obesity - Continue to work on healthy eating and exercise.  - Will monitor her weight.   3. Health Maintenance - Continue to eat a healthy diet and exercise.  - Follow up in one year or sooner if needed - Advised to follow up with GYN for complete exam since it has been many years.     -We reviewed the PMH, PSH, FH, SH, Meds and Allergies. -We provided refills for any medications we will prescribe as needed. -We addressed current concerns per orders and patient instructions. -We have advised patient to follow up per instructions below. -Patient advised to return or notify a provider immediately if symptoms worsen or persist or new concerns arise.  Patient Instructions  It was great seeing you again today. Please follow up with your gynocologist for a complete vaginal exam. Continue to work on diet, exercise and weight loss. I will let you know what your blood work shows. You can follow up with me in a year for your next physical or sooner if you need anything. Have a great vacation at the beach, you deserve a some rest and relaxation.   Health Maintenance Adopting a healthy lifestyle and getting preventive care can go a long way to promote health and wellness. Talk with your health care provider about what schedule of regular examinations is right for you. This is a good chance for you to check in with your provider about disease prevention and staying healthy. In between checkups, there are plenty of things you can do on your own. Experts have done a lot of research about which lifestyle changes and preventive measures are most likely to keep you healthy. Ask your health care provider for more information. WEIGHT AND DIET  Eat a healthy diet  Be sure to include plenty of vegetables, fruits, low-fat dairy  products, and lean protein.  Do not eat a lot of foods high in solid fats, added sugars, or salt.  Get regular exercise. This is one of the most important things you can do for your health.  Most adults should exercise for at least 150 minutes each week. The exercise should increase your heart rate and make you sweat (moderate-intensity exercise).  Most adults should also do strengthening exercises at least twice a week. This is in addition to the moderate-intensity exercise.  Maintain a healthy weight  Body mass index (BMI) is a measurement that can be used to identify possible weight problems. It estimates body fat based on height and weight. Your health care provider can help determine your BMI and help you achieve or maintain a healthy weight.  For females 20 years of age and older:   A BMI below 18.5 is considered underweight.  A BMI of 18.5 to 24.9 is normal.  A BMI of 25 to 29.9 is considered overweight.    A BMI of 30 and above is considered obese.  Watch levels of cholesterol and blood lipids  You should start having your blood tested for lipids and cholesterol at 42 years of age, then have this test every 5 years.  You may need to have your cholesterol levels checked more often if:  Your lipid or cholesterol levels are high.  You are older than 42 years of age.  You are at high risk for heart disease.  CANCER SCREENING   Lung Cancer  Lung cancer screening is recommended for adults 25-35 years old who are at high risk for lung cancer because of a history of smoking.  A yearly low-dose CT scan of the lungs is recommended for people who:  Currently smoke.  Have quit within the past 15 years.  Have at least a 30-pack-year history of smoking. A pack year is smoking an average of one pack of cigarettes a day for 1 year.  Yearly screening should continue until it has been 15 years since you quit.  Yearly screening should stop if you develop a health problem that  would prevent you from having lung cancer treatment.  Breast Cancer  Practice breast self-awareness. This means understanding how your breasts normally appear and feel.  It also means doing regular breast self-exams. Let your health care provider know about any changes, no matter how small.  If you are in your 20s or 30s, you should have a clinical breast exam (CBE) by a health care provider every 1-3 years as part of a regular health exam.  If you are 32 or older, have a CBE every year. Also consider having a breast X-ray (mammogram) every year.  If you have a family history of breast cancer, talk to your health care provider about genetic screening.  If you are at high risk for breast cancer, talk to your health care provider about having an MRI and a mammogram every year.  Breast cancer gene (BRCA) assessment is recommended for women who have family members with BRCA-related cancers. BRCA-related cancers include:  Breast.  Ovarian.  Tubal.  Peritoneal cancers.  Results of the assessment will determine the need for genetic counseling and BRCA1 and BRCA2 testing. Cervical Cancer Routine pelvic examinations to screen for cervical cancer are no longer recommended for nonpregnant women who are considered low risk for cancer of the pelvic organs (ovaries, uterus, and vagina) and who do not have symptoms. A pelvic examination may be necessary if you have symptoms including those associated with pelvic infections. Ask your health care provider if a screening pelvic exam is right for you.   The Pap test is the screening test for cervical cancer for women who are considered at risk.  If you had a hysterectomy for a problem that was not cancer or a condition that could lead to cancer, then you no longer need Pap tests.  If you are older than 65 years, and you have had normal Pap tests for the past 10 years, you no longer need to have Pap tests.  If you have had past treatment for cervical  cancer or a condition that could lead to cancer, you need Pap tests and screening for cancer for at least 20 years after your treatment.  If you no longer get a Pap test, assess your risk factors if they change (such as having a new sexual partner). This can affect whether you should start being screened again.  Some women have medical problems that increase their chance  of getting cervical cancer. If this is the case for you, your health care provider may recommend more frequent screening and Pap tests.  The human papillomavirus (HPV) test is another test that may be used for cervical cancer screening. The HPV test looks for the virus that can cause cell changes in the cervix. The cells collected during the Pap test can be tested for HPV.  The HPV test can be used to screen women 30 years of age and older. Getting tested for HPV can extend the interval between normal Pap tests from three to five years.  An HPV test also should be used to screen women of any age who have unclear Pap test results.  After 42 years of age, women should have HPV testing as often as Pap tests.  Colorectal Cancer  This type of cancer can be detected and often prevented.  Routine colorectal cancer screening usually begins at 42 years of age and continues through 42 years of age.  Your health care provider may recommend screening at an earlier age if you have risk factors for colon cancer.  Your health care provider may also recommend using home test kits to check for hidden blood in the stool.  A small camera at the end of a tube can be used to examine your colon directly (sigmoidoscopy or colonoscopy). This is done to check for the earliest forms of colorectal cancer.  Routine screening usually begins at age 50.  Direct examination of the colon should be repeated every 5-10 years through 42 years of age. However, you may need to be screened more often if early forms of precancerous polyps or small growths are  found. Skin Cancer  Check your skin from head to toe regularly.  Tell your health care provider about any new moles or changes in moles, especially if there is a change in a mole's shape or color.  Also tell your health care provider if you have a mole that is larger than the size of a pencil eraser.  Always use sunscreen. Apply sunscreen liberally and repeatedly throughout the day.  Protect yourself by wearing long sleeves, pants, a wide-brimmed hat, and sunglasses whenever you are outside. HEART DISEASE, DIABETES, AND HIGH BLOOD PRESSURE   Have your blood pressure checked at least every 1-2 years. High blood pressure causes heart disease and increases the risk of stroke.  If you are between 55 years and 79 years old, ask your health care provider if you should take aspirin to prevent strokes.  Have regular diabetes screenings. This involves taking a blood sample to check your fasting blood sugar level.  If you are at a normal weight and have a low risk for diabetes, have this test once every three years after 42 years of age.  If you are overweight and have a high risk for diabetes, consider being tested at a younger age or more often. PREVENTING INFECTION  Hepatitis B  If you have a higher risk for hepatitis B, you should be screened for this virus. You are considered at high risk for hepatitis B if:  You were born in a country where hepatitis B is common. Ask your health care provider which countries are considered high risk.  Your parents were born in a high-risk country, and you have not been immunized against hepatitis B (hepatitis B vaccine).  You have HIV or AIDS.  You use needles to inject street drugs.  You live with someone who has hepatitis B.  You have   had sex with someone who has hepatitis B.  You get hemodialysis treatment.  You take certain medicines for conditions, including cancer, organ transplantation, and autoimmune conditions. Hepatitis C  Blood  testing is recommended for:  Everyone born from 1945 through 1965.  Anyone with known risk factors for hepatitis C. Sexually transmitted infections (STIs)  You should be screened for sexually transmitted infections (STIs) including gonorrhea and chlamydia if:  You are sexually active and are younger than 42 years of age.  You are older than 42 years of age and your health care provider tells you that you are at risk for this type of infection.  Your sexual activity has changed since you were last screened and you are at an increased risk for chlamydia or gonorrhea. Ask your health care provider if you are at risk.  If you do not have HIV, but are at risk, it may be recommended that you take a prescription medicine daily to prevent HIV infection. This is called pre-exposure prophylaxis (PrEP). You are considered at risk if:  You are sexually active and do not regularly use condoms or know the HIV status of your partner(s).  You take drugs by injection.  You are sexually active with a partner who has HIV. Talk with your health care provider about whether you are at high risk of being infected with HIV. If you choose to begin PrEP, you should first be tested for HIV. You should then be tested every 3 months for as long as you are taking PrEP.  PREGNANCY   If you are premenopausal and you may become pregnant, ask your health care provider about preconception counseling.  If you may become pregnant, take 400 to 800 micrograms (mcg) of folic acid every day.  If you want to prevent pregnancy, talk to your health care provider about birth control (contraception). OSTEOPOROSIS AND MENOPAUSE   Osteoporosis is a disease in which the bones lose minerals and strength with aging. This can result in serious bone fractures. Your risk for osteoporosis can be identified using a bone density scan.  If you are 65 years of age or older, or if you are at risk for osteoporosis and fractures, ask your  health care provider if you should be screened.  Ask your health care provider whether you should take a calcium or vitamin D supplement to lower your risk for osteoporosis.  Menopause may have certain physical symptoms and risks.  Hormone replacement therapy may reduce some of these symptoms and risks. Talk to your health care provider about whether hormone replacement therapy is right for you.  HOME CARE INSTRUCTIONS   Schedule regular health, dental, and eye exams.  Stay current with your immunizations.   Do not use any tobacco products including cigarettes, chewing tobacco, or electronic cigarettes.  If you are pregnant, do not drink alcohol.  If you are breastfeeding, limit how much and how often you drink alcohol.  Limit alcohol intake to no more than 1 drink per day for nonpregnant women. One drink equals 12 ounces of beer, 5 ounces of wine, or 1 ounces of hard liquor.  Do not use street drugs.  Do not share needles.  Ask your health care provider for help if you need support or information about quitting drugs.  Tell your health care provider if you often feel depressed.  Tell your health care provider if you have ever been abused or do not feel safe at home. Document Released: 06/03/2011 Document Revised: 04/04/2014 Document Reviewed:   10/20/2013 [employer] Patient Information 2015 [employer]. This information is not intended to replace advice given to you by your health care provider. Make sure you discuss any questions you have with your health care provider.      Cory Nafziger    

## 2015-04-14 NOTE — Progress Notes (Signed)
Pre visit review using our clinic review tool, if applicable. No additional management support is needed unless otherwise documented below in the visit note. 

## 2015-04-14 NOTE — Addendum Note (Signed)
Addended by: Azucena FreedMILLNER, Kialee Kham C on: 04/14/2015 10:26 AM   Modules accepted: Orders

## 2015-04-14 NOTE — Assessment & Plan Note (Signed)
We talked about weight loss and the importance of maintaining a healthy weight. She will continue to eat healthy and try and work more exercise into her life.   Wt Readings from Last 3 Encounters:  04/14/15 312 lb 11.2 oz (141.84 kg)  03/31/15 318 lb 1.6 oz (144.289 kg)  04/17/12 314 lb (142.429 kg)

## 2015-04-16 LAB — URINE CULTURE

## 2015-04-17 LAB — SEDIMENTATION RATE: Sed Rate: 9 mm/hr (ref 0–22)

## 2015-04-18 LAB — CYTOLOGY - PAP

## 2015-04-19 ENCOUNTER — Telehealth: Payer: Self-pay | Admitting: Adult Health

## 2015-04-19 NOTE — Telephone Encounter (Signed)
Called and left message for patient to call back.

## 2015-05-04 ENCOUNTER — Telehealth: Payer: Self-pay | Admitting: Family Medicine

## 2015-05-04 ENCOUNTER — Other Ambulatory Visit: Payer: Self-pay | Admitting: Adult Health

## 2015-05-04 DIAGNOSIS — N3000 Acute cystitis without hematuria: Secondary | ICD-10-CM

## 2015-05-04 MED ORDER — CIPROFLOXACIN HCL 250 MG PO TABS: 250.0000 mg | ORAL_TABLET | Freq: Two times a day (BID) | ORAL | Status: DC

## 2015-05-04 NOTE — Telephone Encounter (Signed)
Spoke to patient on the phone, per patient states she has been having back pain by her kidneys, nausea, and dysuria.She is unable to get to a lab or come to the office because she works 5a-5p. Patient was advised that this was the only time I would call her in a ABX.   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

## 2015-05-04 NOTE — Telephone Encounter (Signed)
Patient would like speak to you regarding her having a UTI.  She said she spoke to you about her lab results and you wanted to know if she felt like she had a UTI.

## 2015-05-04 NOTE — Telephone Encounter (Signed)
Called and spoke with pt and pt states she has been having back pain by her kidneys, nausea, and dysuria.  Pt states she was brewing a small UTI before her beach trip and possibly sitting in her bathing suit made it worse. Advised pt to come in for an office visit. Pt states she works 5 am to 5:30 pm and she has had a death in the family.  Advised pt about her pap results and pt denies any bleeding or complications.  [facility]  Pls avise.

## 2015-08-18 ENCOUNTER — Encounter: Payer: Self-pay | Admitting: Adult Health

## 2015-08-18 ENCOUNTER — Ambulatory Visit (INDEPENDENT_AMBULATORY_CARE_PROVIDER_SITE_OTHER): Payer: Commercial Managed Care - PPO | Admitting: Adult Health

## 2015-08-18 VITALS — BP 130/80 | HR 97 | Wt 319.4 lb

## 2015-08-18 DIAGNOSIS — J0100 Acute maxillary sinusitis, unspecified: Secondary | ICD-10-CM | POA: Diagnosis not present

## 2015-08-18 MED ORDER — MAGIC MOUTHWASH W/LIDOCAINE: 5.0000 mL | Freq: Three times a day (TID) | ORAL | Status: AC | PRN

## 2015-08-18 NOTE — Patient Instructions (Addendum)
It was great seeing you again!  I think you have a viral sinus infection. Let me know if you are not feeling any better in the next 7 days or sooner if your symptoms get worse.   Use the Magic Mouth was TID as needed  Flonase and claritin will help keep the sinus passages open.   Let me know if you need anything  Sinusitis Sinusitis is redness, soreness, and inflammation of the paranasal sinuses. Paranasal sinuses are air pockets within the bones of your face (beneath the eyes, the middle of the forehead, or above the eyes). In healthy paranasal sinuses, mucus is able to drain out, and air is able to circulate through them by way of your nose. However, when your paranasal sinuses are inflamed, mucus and air can become trapped. This can allow bacteria and other germs to grow and cause infection. Sinusitis can develop quickly and last only a short time (acute) or continue over a long period (chronic). Sinusitis that lasts for more than 12 weeks is considered chronic.  CAUSES  Causes of sinusitis include:  Allergies.  Structural abnormalities, such as displacement of the cartilage that separates your nostrils (deviated septum), which can decrease the air flow through your nose and sinuses and affect sinus drainage.  Functional abnormalities, such as when the small hairs (cilia) that line your sinuses and help remove mucus do not work properly or are not present. SIGNS AND SYMPTOMS  Symptoms of acute and chronic sinusitis are the same. The primary symptoms are pain and pressure around the affected sinuses. Other symptoms include:  Upper toothache.  Earache.  Headache.  Bad breath.  Decreased sense of smell and taste.  A cough, which worsens when you are lying flat.  Fatigue.  Fever.  Thick drainage from your nose, which often is green and may contain pus (purulent).  Swelling and warmth over the affected sinuses. DIAGNOSIS  Your health care provider will perform a physical  exam. During the exam, your health care provider may:  Look in your nose for signs of abnormal growths in your nostrils (nasal polyps).  Tap over the affected sinus to check for signs of infection.  View the inside of your sinuses (endoscopy) using an imaging device that has a light attached (endoscope). If your health care provider suspects that you have chronic sinusitis, one or more of the following tests may be recommended:  Allergy tests.  Nasal culture. A sample of mucus is taken from your nose, sent to a lab, and screened for bacteria.  Nasal cytology. A sample of mucus is taken from your nose and examined by your health care provider to determine if your sinusitis is related to an allergy. TREATMENT  Most cases of acute sinusitis are related to a viral infection and will resolve on their own within 10 days. Sometimes medicines are prescribed to help relieve symptoms (pain medicine, decongestants, nasal steroid sprays, or saline sprays).  However, for sinusitis related to a bacterial infection, your health care provider will prescribe antibiotic medicines. These are medicines that will help kill the bacteria causing the infection.  Rarely, sinusitis is caused by a fungal infection. In theses cases, your health care provider will prescribe antifungal medicine. For some cases of chronic sinusitis, surgery is needed. Generally, these are cases in which sinusitis recurs more than 3 times per year, despite other treatments. HOME CARE INSTRUCTIONS   Drink plenty of water. Water helps thin the mucus so your sinuses can drain more easily.  Use  a humidifier.  Inhale steam 3 to 4 times a day (for example, sit in the bathroom with the shower running).  Apply a warm, moist washcloth to your face 3 to 4 times a day, or as directed by your health care provider.  Use saline nasal sprays to help moisten and clean your sinuses.  Take medicines only as directed by your health care provider.  If  you were prescribed either an antibiotic or antifungal medicine, finish it all even if you start to feel better. SEEK IMMEDIATE MEDICAL CARE IF:  You have increasing pain or severe headaches.  You have nausea, vomiting, or drowsiness.  You have swelling around your face.  You have vision problems.  You have a stiff neck.  You have difficulty breathing. MAKE SURE YOU:   Understand these instructions.  Will watch your condition.  Will get help right away if you are not doing well or get worse. Document Released: 11/18/2005 Document Revised: 04/04/2014 Document Reviewed: 12/03/2011 [facility] Patient Information 2015 [place], [place]. This information is not intended to replace advice given to you by your health care provider. Make sure you discuss any questions you have with your health care provider.

## 2015-08-18 NOTE — Progress Notes (Signed)
Subjective:    Patient ID: Courtney Little, female    DOB: 07/02/1971, 44 y.o.   MRN: [id number]  HPI 44 year old female who presents to the office today for sore throat and ear pain with sinus pain and pressure for less than 24 hours. Endorses low grade fever (99 degrees), no nausea, vomiting or diarrhea.   Review of Systems  Constitutional: Negative.   HENT: Positive for congestion, ear pain, postnasal drip, rhinorrhea, sinus pressure and sore throat. Negative for trouble swallowing.   Respiratory: Positive for cough. Negative for chest tightness and shortness of breath.   Cardiovascular: Negative.   Gastrointestinal: Negative.   Musculoskeletal: Positive for myalgias.  Neurological: Negative.    No past medical history on file.  Social History   Social History  . Marital Status: Single    Spouse Name: N/A  . Number of Children: N/A  . Years of Education: N/A   Occupational History  . Not on file.   Social History Main Topics  . Smoking status: Never Smoker   . Smokeless tobacco: Not on file  . Alcohol Use: No  . Drug Use: No  . Sexual Activity: Not on file   Other Topics Concern  . Not on file   Social History Narrative   Nurse and one son who is 29.    One dog and two cats   Beach and baseball likes to go to concerts    Past Surgical History  Procedure Laterality Date  . Endometrial ablation    . Right ovary removed      Family History  Problem Relation Age of Onset  . Arthritis Mother     Alive  . Arthritis Maternal Grandmother   . Diabetes Mother   . Diabetes Father     Alive  . Hypertension Father     Allergies  Allergen Reactions  . Atenolol   . Iodine   . Iohexol      Code: RASH, Desc: ITCHING WITH IV CM 13 YRS AGO- 25MG  BENADRYL IV GIVEN PRIOR TO SCAN- PT DID FINE, Onset Date: [id number]   . Latex     Current Outpatient Prescriptions on File Prior to Visit  Medication Sig Dispense Refill  . Bioflavonoid Products (VITAMIN C) CHEW Chew by  mouth.    . Biotin 10 MG TABS Take by mouth.    . EVENING PRIMROSE OIL PO Take by mouth.    . fish oil-omega-3 fatty acids 1000 MG capsule Take 1 g by mouth daily.     No current facility-administered medications on file prior to visit.    BP 130/80 mmHg  Pulse 97  Wt 319 lb 6.4 oz (144.879 kg)       Objective:   Physical Exam  Constitutional: She is oriented to person, place, and time. She appears well-developed and well-nourished. No distress.  Neck: Normal range of motion. Neck supple.  Cardiovascular: Normal rate, regular rhythm, normal heart sounds and intact distal pulses.  Exam reveals no gallop and no friction rub.   No murmur heard. Pulmonary/Chest: Effort normal and breath sounds normal. No respiratory distress. She has no wheezes. She has no rales. She exhibits no tenderness.  Lymphadenopathy:    She has no cervical adenopathy.  Neurological: She is alert and oriented to person, place, and time.  Skin: Skin is warm and dry. No rash noted. She is not diaphoretic. No erythema. No pallor.  Psychiatric: She has a normal mood and affect. Her behavior is normal.  Judgment and thought content normal.  Nursing note and vitals reviewed.      Assessment & Plan:  1. Acute maxillary sinusitis, recurrence not specified - Likely viral  - magic mouthwash w/lidocaine SOLN; Take 5 mLs by mouth 3 (three) times daily as needed for mouth pain.  Dispense: 50 mL; Refill: 0 - Stay hydrated and rest - OTC Flonase and Claritin  - Follow up if no improvement

## 2015-08-18 NOTE — Progress Notes (Signed)
Pre visit review using our clinic review tool, if applicable. No additional management support is needed unless otherwise documented below in the visit note.
